# Patient Record
Sex: Female | Born: 1975 | Hispanic: Yes | Marital: Single | Smoking: Never smoker
Health system: Southern US, Community
[De-identification: ages and names within clinical notes are randomized; demographics above are authoritative.]

## PROBLEM LIST (undated history)

## (undated) DIAGNOSIS — R102 Pelvic and perineal pain: Secondary | ICD-10-CM

## (undated) DIAGNOSIS — M1990 Unspecified osteoarthritis, unspecified site: Secondary | ICD-10-CM

## (undated) DIAGNOSIS — M722 Plantar fascial fibromatosis: Secondary | ICD-10-CM

## (undated) DIAGNOSIS — G8929 Other chronic pain: Secondary | ICD-10-CM

## (undated) DIAGNOSIS — K219 Gastro-esophageal reflux disease without esophagitis: Secondary | ICD-10-CM

---

## 2013-09-02 ENCOUNTER — Emergency Department (HOSPITAL_COMMUNITY)
Admission: EM | Admit: 2013-09-02 | Discharge: 2013-09-02 | Disposition: A | Discharge status: Home / Self Care | Payer: Medicaid Other | Attending: Emergency Medicine | Admitting: Emergency Medicine

## 2013-09-02 ENCOUNTER — Encounter (HOSPITAL_COMMUNITY): Payer: Self-pay | Admitting: Emergency Medicine

## 2013-09-02 DIAGNOSIS — L03319 Cellulitis of trunk, unspecified: Principal | ICD-10-CM

## 2013-09-02 DIAGNOSIS — L02211 Cutaneous abscess of abdominal wall: Secondary | ICD-10-CM

## 2013-09-02 DIAGNOSIS — L02219 Cutaneous abscess of trunk, unspecified: Secondary | ICD-10-CM | POA: Insufficient documentation

## 2013-09-02 MED ORDER — HYDROCODONE-ACETAMINOPHEN 5-325 MG PO TABS
ORAL_TABLET | ORAL | Status: AC
  Filled 2013-09-02: qty 1

## 2013-09-02 MED ORDER — HYDROCODONE-ACETAMINOPHEN 5-325 MG PO TABS: 1.0000 | ORAL_TABLET | ORAL | Status: DC | PRN

## 2013-09-02 MED ORDER — CEPHALEXIN 500 MG PO CAPS: 500.0000 mg | ORAL_CAPSULE | Freq: Two times a day (BID) | ORAL | Status: AC

## 2013-09-02 MED ORDER — SULFAMETHOXAZOLE-TRIMETHOPRIM 800-160 MG PO TABS: 1.0000 | ORAL_TABLET | Freq: Two times a day (BID) | ORAL | Status: DC

## 2013-09-02 MED ORDER — HYDROCODONE-ACETAMINOPHEN 5-325 MG PO TABS
1.0000 | ORAL_TABLET | Freq: Once | ORAL | Status: AC
  Administered 2013-09-02: 1 via ORAL

## 2013-09-02 NOTE — Discharge Instructions (Signed)
Warm compresses several times a day  Vicodin for severe pain - Please be careful with this medication.  It can cause drowsiness.  Use caution while driving, operating machinery, drinking alcohol, or any other activities that may impair your physical or mental abilities.   Return to the emergency department if you develop any changing/worsening condition, fever, repeated vomiting, or any other concerns (please read additional information regarding your condition below)    Abscess An abscess is an infected area that contains a collection of pus and debris.It can occur in almost any part of the body. An abscess is also known as a furuncle or boil. CAUSES  An abscess occurs when tissue gets infected. This can occur from blockage of oil or sweat glands, infection of hair follicles, or a minor injury to the skin. As the body tries to fight the infection, pus collects in the area and creates pressure under the skin. This pressure causes pain. People with weakened immune systems have difficulty fighting infections and get certain abscesses more often.  SYMPTOMS Usually an abscess develops on the skin and becomes a painful mass that is red, warm, and tender. If the abscess forms under the skin, you may feel a moveable soft area under the skin. Some abscesses break open (rupture) on their own, but most will continue to get worse without care. The infection can spread deeper into the body and eventually into the bloodstream, causing you to feel ill.  DIAGNOSIS  Your caregiver will take your medical history and perform a physical exam. A sample of fluid may also be taken from the abscess to determine what is causing your infection. TREATMENT  Your caregiver may prescribe antibiotic medicines to fight the infection. However, taking antibiotics alone usually does not cure an abscess. Your caregiver may need to make a small cut (incision) in the abscess to drain the pus. In some cases, gauze is packed into the  abscess to reduce pain and to continue draining the area. HOME CARE INSTRUCTIONS   Only take over-the-counter or prescription medicines for pain, discomfort, or fever as directed by your caregiver.  If you were prescribed antibiotics, take them as directed. Finish them even if you start to feel better.  If gauze is used, follow your caregiver's directions for changing the gauze.  To avoid spreading the infection:  Keep your draining abscess covered with a bandage.  Wash your hands well.  Do not share personal care items, towels, or whirlpools with others.  Avoid skin contact with others.  Keep your skin and clothes clean around the abscess.  Keep all follow-up appointments as directed by your caregiver. SEEK MEDICAL CARE IF:   You have increased pain, swelling, redness, fluid drainage, or bleeding.  You have muscle aches, chills, or a general ill feeling.  You have a fever. MAKE SURE YOU:   Understand these instructions.  Will watch your condition.  Will get help right away if you are not doing well or get worse. Document Released: 12/07/2004 Document Revised: 08/29/2011 Document Reviewed: 05/12/2011 [facility] Patient Information 2015 [place], [place]. This information is not intended to replace advice given to you by your health care provider. Make sure you discuss any questions you have with your health care provider.

## 2013-09-02 NOTE — ED Notes (Signed)
Pt changed into gown.

## 2013-09-02 NOTE — ED Notes (Signed)
Per pt had raised area pop up under rt arm pit/side.  Pt states pain has increased.  Hard knot at surface with redness noted.  Has hx of same yet different location.

## 2013-09-02 NOTE — ED Provider Notes (Signed)
CSN: [id number]     Arrival date & time 09/02/13  1605 History  This chart was scribed for non-physician practitioner, Coral CeoJessica Palmer, PA-C working with Junius ArgyleForrest S Harrison, MD by Joaquin MusicKristina Sanchez-Matthews, ED scribe. This patient was seen in room WTR8/WTR8 and the patient's care was started at 6:23 PM.   Chief Complaint  Patient presents with  . Abscess   The history is provided by the patient. No language interpreter was used.   HPI Comments: Madison Flowers is a 38 y.o. Female with no PMH who presents to the Emergency Department complaining of an abscess to her R flank that presented 3 days ago. No previous known wound. No open wounds or drainage. She c/o a constant pain and swelling that has gradually increased. Pain worse with movement of her right arm and palpation. Pt has a hx of abscesses (breast and abdomen) but never in R flank. She is currently on her menstrual cycle. She denies having medication allergies or medical problems. No fever, chills, weakness, nausea, emesis, abdominal pain or other concerns.    History reviewed. No pertinent past medical history. History reviewed. No pertinent past surgical history. History reviewed. No pertinent family history. History  Substance Use Topics  . Smoking status: Never Smoker   . Smokeless tobacco: Not on file  . Alcohol Use: Yes     Comment: occasional   OB History   Grav Para Term Preterm Abortions TAB SAB Ect Mult Living                  Review of Systems  Constitutional: Negative for fever, chills, diaphoresis, activity change, appetite change and fatigue.  Gastrointestinal: Negative for nausea, vomiting and abdominal pain.  Musculoskeletal: Negative for myalgias.  Skin: Positive for color change and wound. Negative for rash.       Abscess.   Neurological: Negative for dizziness, weakness, light-headedness and headaches.  All other systems reviewed and are negative.   Allergies  Review of patient's allergies  indicates no known allergies.  Home Medications   Prior to Admission medications   Not on File   Triage Vitals:BP 122/55  Pulse 77  Temp(Src) 98.3 F (36.8 C) (Oral)  Resp 16  SpO2 98%  LMP 08/28/2013  Filed Vitals:   09/02/13 1615  BP: 122/55  Pulse: 77  Temp: 98.3 F (36.8 C)  TempSrc: Oral  Resp: 16  SpO2: 98%    Physical Exam  Nursing note and vitals reviewed. Constitutional: She is oriented to person, place, and time. She appears well-developed and well-nourished. No distress.  Non-toxic  HENT:  Head: Normocephalic and atraumatic.  Right Ear: External ear normal.  Left Ear: External ear normal.  Mouth/Throat: Oropharynx is clear and moist.  Eyes: Conjunctivae and EOM are normal.  Neck: Neck supple. No tracheal deviation present.  Cardiovascular: Normal rate.   Pulmonary/Chest: Effort normal. No respiratory distress.  Abdominal: Soft. She exhibits no distension. There is no tenderness.    Musculoskeletal: Normal range of motion. She exhibits no edema and no tenderness.  Neurological: She is alert and oriented to person, place, and time.  Skin: Skin is warm and dry. She is not diaphoretic.  3 x 4 cm area of erythema and induration to the right flank with a small area of fluctuance in the center. No open wounds or drainage. Area warm to the touch and tender.   Psychiatric: She has a normal mood and affect. Her behavior is normal.    ED Course  Procedures (including critical  care time)  DIAGNOSTIC STUDIES: Oxygen Saturation is 98% on RA, normal by my interpretation.    COORDINATION OF CARE: 6:25 PM-Discussed treatment plan which includes incision and drainage with pt at bedside and pt agreed to plan.   6:27 PM- INCISION AND DRAINAGE Performed by: Coral CeoJessica Palmer, PA-C Consent: Verbal consent obtained. Risks and benefits: risks, benefits and alternatives were discussed  Sterile Prep and Drape  Type: abscess  Body area: R flank  Local anesthetic:  lidocaine 2 % without epinephrine  Anesthetic total: 4 ml  Incision: 11 Blade  Complexity: complex Blunt dissection to breakup loculations  Drainage amount: moderate   Flushed with copious amount of sterile saline  Patient tolerance: Patient tolerated the procedure well with no immediate complications.  Packed with 1/4" iodoform gauze   6:32 PM-Will give Hydrocodone while in the ED.Informed pt to apply warm compresses to R flank and keep area clean & dry. Informed pt to have packed guaze removed in 2 days by returning to the ED; she denies having a PCP due to moving recently. Will discharge pt with antibiotic and Hydrocodone for pain.   Labs Review Labs Reviewed - No data to display  Imaging Review No results found.   EKG Interpretation None      MDM   Madison Flowers is a 38 y.o. female with no PMH who presents to the Emergency Department complaining of an abscess to her R flank that presented 3 days ago. Abscess drained in the ED and gauze packing material placed. Patient afebrile and non-toxic in appearance. Vital signs stable. Will discharge home on antibiotics. Feel this is likely infected with MRSA. Will cover with bactrim. Instructed patient to return in two days for packing removal. Wound care instructions discussed. Return precautions, discharge instructions, and follow-up was discussed with the patient before discharge.      Discharge Medication List as of 09/02/2013  6:40 PM    START taking these medications   Details  cephALEXin (KEFLEX) 500 MG capsule Take 1 capsule (500 mg total) by mouth 2 (two) times daily., Starting 09/02/2013, Last dose on Tue 09/02/13, Print    HYDROcodone-acetaminophen (NORCO/VICODIN) 5-325 MG per tablet Take 1-2 tablets by mouth every 4 (four) hours as needed for moderate pain or severe pain., Starting 09/02/2013, Until Discontinued, Print    sulfamethoxazole-trimethoprim (SEPTRA DS) 800-160 MG per tablet Take 1 tablet by mouth  every 12 (twelve) hours., Starting 09/02/2013, Until Discontinued, Print        Final impressions: 1. Abscess of flank      Thomasenia SalesJessica Katlin Palmer PA-C   I personally performed the services described in this documentation, which was scribed in my presence. The recorded information has been reviewed and is accurate.  Jillyn LedgerJessica K Palmer, PA-C 09/03/13 1105

## 2013-09-02 NOTE — ED Notes (Signed)
Pt states she has a ride home. 

## 2013-09-04 NOTE — ED Provider Notes (Signed)
Medical screening examination/treatment/procedure(s) were performed by non-physician practitioner and as supervising physician I was immediately available for consultation/collaboration.   EKG Interpretation None        Junius ArgyleForrest S Harrison, MD 09/04/13 1739

## 2013-12-12 ENCOUNTER — Other Ambulatory Visit (HOSPITAL_COMMUNITY)
Admission: RE | Admit: 2013-12-12 | Discharge: 2013-12-12 | Disposition: A | Discharge status: Home / Self Care | Payer: Medicaid Other | Source: Ambulatory Visit | Attending: Family Medicine | Admitting: Family Medicine

## 2013-12-12 ENCOUNTER — Ambulatory Visit: Payer: Medicaid Other | Attending: Family Medicine | Admitting: Family Medicine

## 2013-12-12 ENCOUNTER — Encounter: Payer: Self-pay | Admitting: Family Medicine

## 2013-12-12 VITALS — BP 124/80 | HR 98 | Temp 98.4°F | Resp 18 | Ht 61.0 in

## 2013-12-12 DIAGNOSIS — N760 Acute vaginitis: Secondary | ICD-10-CM | POA: Insufficient documentation

## 2013-12-12 DIAGNOSIS — Z01419 Encounter for gynecological examination (general) (routine) without abnormal findings: Secondary | ICD-10-CM | POA: Insufficient documentation

## 2013-12-12 DIAGNOSIS — Z975 Presence of (intrauterine) contraceptive device: Secondary | ICD-10-CM | POA: Insufficient documentation

## 2013-12-12 DIAGNOSIS — S76111A Strain of right quadriceps muscle, fascia and tendon, initial encounter: Secondary | ICD-10-CM | POA: Diagnosis not present

## 2013-12-12 DIAGNOSIS — S8001XA Contusion of right knee, initial encounter: Secondary | ICD-10-CM | POA: Diagnosis not present

## 2013-12-12 DIAGNOSIS — S86819A Strain of other muscle(s) and tendon(s) at lower leg level, unspecified leg, initial encounter: Secondary | ICD-10-CM | POA: Insufficient documentation

## 2013-12-12 DIAGNOSIS — S86811A Strain of other muscle(s) and tendon(s) at lower leg level, right leg, initial encounter: Secondary | ICD-10-CM

## 2013-12-12 DIAGNOSIS — Z1151 Encounter for screening for human papillomavirus (HPV): Secondary | ICD-10-CM | POA: Insufficient documentation

## 2013-12-12 DIAGNOSIS — Z113 Encounter for screening for infections with a predominantly sexual mode of transmission: Secondary | ICD-10-CM | POA: Diagnosis present

## 2013-12-12 DIAGNOSIS — Z124 Encounter for screening for malignant neoplasm of cervix: Secondary | ICD-10-CM | POA: Diagnosis not present

## 2013-12-12 DIAGNOSIS — IMO0002 Reserved for concepts with insufficient information to code with codable children: Secondary | ICD-10-CM | POA: Insufficient documentation

## 2013-12-12 DIAGNOSIS — A499 Bacterial infection, unspecified: Secondary | ICD-10-CM

## 2013-12-12 DIAGNOSIS — Z23 Encounter for immunization: Secondary | ICD-10-CM | POA: Diagnosis not present

## 2013-12-12 DIAGNOSIS — K644 Residual hemorrhoidal skin tags: Secondary | ICD-10-CM | POA: Diagnosis not present

## 2013-12-12 DIAGNOSIS — N941 Dyspareunia: Secondary | ICD-10-CM | POA: Diagnosis not present

## 2013-12-12 DIAGNOSIS — W010XXA Fall on same level from slipping, tripping and stumbling without subsequent striking against object, initial encounter: Secondary | ICD-10-CM | POA: Insufficient documentation

## 2013-12-12 DIAGNOSIS — K649 Unspecified hemorrhoids: Secondary | ICD-10-CM | POA: Insufficient documentation

## 2013-12-12 DIAGNOSIS — B9689 Other specified bacterial agents as the cause of diseases classified elsewhere: Secondary | ICD-10-CM

## 2013-12-12 HISTORY — DX: Unspecified hemorrhoids: K64.9

## 2013-12-12 HISTORY — DX: Strain of other muscle(s) and tendon(s) at lower leg level, unspecified leg, initial encounter: S86.819A

## 2013-12-12 LAB — POCT URINALYSIS DIPSTICK
BILIRUBIN UA: NEGATIVE
GLUCOSE UA: NEGATIVE
Ketones, UA: NEGATIVE
LEUKOCYTES UA: NEGATIVE
NITRITE UA: NEGATIVE
Protein, UA: 30
Spec Grav, UA: 1.02
Urobilinogen, UA: 0.2
pH, UA: 6

## 2013-12-12 LAB — POCT URINE PREGNANCY: Preg Test, Ur: NEGATIVE

## 2013-12-12 MED ORDER — IBUPROFEN 600 MG PO TABS: 600.0000 mg | ORAL_TABLET | Freq: Three times a day (TID) | ORAL | Status: DC | PRN

## 2013-12-12 NOTE — Assessment & Plan Note (Signed)
A; normal pelvic exam P: F/u cultures and pap smear Position variation  F/u prn worsening symptoms, next step is pelvic U/S

## 2013-12-12 NOTE — Assessment & Plan Note (Signed)
A: small skin tags from previous hemorrhoids P: OTC tucks wipes

## 2013-12-12 NOTE — Progress Notes (Signed)
Establish Care Complaining of low abdominal pain.  Stated has pain and bleeding with sex

## 2013-12-12 NOTE — Patient Instructions (Addendum)
Madison Flowers,  Gracias por venido hoy. Your pelvic exam is normal.  You will be called with pap results and blood test results.  F/u if pain worsens. The next step is pelvic ultrasound.    For R knee pain: Rest Ice Elevate Take ibuprofen  Return if pain worsens or you have trouble walking.  Dr. Armen PickupFunches

## 2013-12-12 NOTE — Progress Notes (Signed)
   Subjective:    Patient ID: Madison Flowers, female    DOB: 04/14/1975, 38 y.o.   MRN: [id number] CC: pelvic pain during sex  HPI 38 yo F presents to establish care and discuss the following:  1. Pelvic pain during sex: x 6 months with most recent sex partner. No similar symptoms with previous partners. Pain occurs in all sexual positions. Some scant bleeding after sex. No condom use. No vaginal discharge. Has an IUD in place for past 6 years. Last pap smear was 7 years ago and normal.   Soc hx: non smoker  Review of Systems As per HPI  Nodules near rectum, blood bowel movement sometimes     Objective:   Physical Exam BP 124/80  Pulse 98  Temp(Src) 98.4 F (36.9 C) (Oral)  Resp 18  Ht 5\' 1"  (1.549 m)  SpO2 99%  LMP 11/25/2013 General appearance: alert, cooperative, no distress and morbidly obese Pelvic: external genitalia normal except small external hemorrhoidal skin tags w/o thrombosis. Vagina normal. Cervix normal with scant bloody discharge. IUD strings visualized. No CMT. No uterine or adnexal mass or tenderness.    While patient was walking down the hall to leave she slipped on the hell of her boots and fell onto her R knee.  She was helped up to a wheelchair. I examined her knee and ankle. R knee-mild erythema, ROM full extension, flexion limited to 90 degrees. Non tender joint line. Non tender patella. Tendon patella tendon at distal insertion.   R ankle- full ROM, no swelling, no erythema, no deformity, no tenderness.  Ice pack was giving. Patient able to ambulate unassisted.  A: R knee contusion with patellar tendon strain  P: Patient was encouraged to go to urgent care if she had worsening pain or inability to ambulate. Ibuprofen prescribed.  Rest, ice, elevation.       Assessment & Plan:

## 2013-12-12 NOTE — Assessment & Plan Note (Signed)
A: R knee contusion with patellar tendon strain.  P: Patient was encouraged to go to urgent care if she had worsening pain or inability to ambulate. Ibuprofen prescribed.  Rest, ice, elevation.

## 2013-12-12 NOTE — Assessment & Plan Note (Signed)
Pap done today  

## 2013-12-12 NOTE — Assessment & Plan Note (Signed)
Screening HIV and RPR 

## 2013-12-13 LAB — HIV ANTIBODY (ROUTINE TESTING W REFLEX): HIV 1&2 Ab, 4th Generation: NONREACTIVE

## 2013-12-13 LAB — RPR

## 2013-12-15 ENCOUNTER — Telehealth: Payer: Self-pay | Admitting: *Deleted

## 2013-12-15 LAB — CERVICOVAGINAL ANCILLARY ONLY
WET PREP (BD AFFIRM): NEGATIVE
Wet Prep (BD Affirm): NEGATIVE
Wet Prep (BD Affirm): POSITIVE — AB

## 2013-12-15 NOTE — Telephone Encounter (Signed)
Message copied by Lajeana Strough,Dyann Kief Alphia Behanna M on Mon Dec 15, 2013  5:39 PM ------      Message from: Dessa PhiFUNCHES, JOSALYN      Created: Mon Dec 15, 2013 12:03 PM       HIV and RPR negative ------

## 2013-12-15 NOTE — Telephone Encounter (Signed)
Unable to contact Pt, wrong number 

## 2013-12-16 DIAGNOSIS — N760 Acute vaginitis: Secondary | ICD-10-CM

## 2013-12-16 DIAGNOSIS — B9689 Other specified bacterial agents as the cause of diseases classified elsewhere: Secondary | ICD-10-CM | POA: Insufficient documentation

## 2013-12-16 LAB — CERVICOVAGINAL ANCILLARY ONLY
Chlamydia: NEGATIVE
Neisseria Gonorrhea: NEGATIVE

## 2013-12-16 LAB — CYTOLOGY - PAP

## 2013-12-16 MED ORDER — METRONIDAZOLE 500 MG PO TABS: 500.0000 mg | ORAL_TABLET | Freq: Two times a day (BID) | ORAL | Status: DC

## 2013-12-16 MED ORDER — FLUCONAZOLE 150 MG PO TABS: 150.0000 mg | ORAL_TABLET | Freq: Every day | ORAL | Status: DC

## 2013-12-16 NOTE — Assessment & Plan Note (Signed)
BV on wet prep  flagyl 500 mg BID to be followed by diflucan.

## 2013-12-16 NOTE — Addendum Note (Signed)
Addended by: Dessa PhiFUNCHES, Aluna Whiston on: 12/16/2013 09:32 AM   Modules accepted: Orders

## 2013-12-17 ENCOUNTER — Emergency Department (HOSPITAL_COMMUNITY): Payer: Medicaid Other

## 2013-12-17 ENCOUNTER — Other Ambulatory Visit: Payer: Self-pay

## 2013-12-17 ENCOUNTER — Emergency Department (HOSPITAL_COMMUNITY)
Admission: EM | Admit: 2013-12-17 | Discharge: 2013-12-17 | Disposition: A | Discharge status: Home / Self Care | Payer: Medicaid Other | Attending: Emergency Medicine | Admitting: Emergency Medicine

## 2013-12-17 ENCOUNTER — Encounter (HOSPITAL_COMMUNITY): Payer: Self-pay | Admitting: Emergency Medicine

## 2013-12-17 DIAGNOSIS — K219 Gastro-esophageal reflux disease without esophagitis: Secondary | ICD-10-CM | POA: Insufficient documentation

## 2013-12-17 DIAGNOSIS — R079 Chest pain, unspecified: Secondary | ICD-10-CM | POA: Diagnosis present

## 2013-12-17 DIAGNOSIS — W01198A Fall on same level from slipping, tripping and stumbling with subsequent striking against other object, initial encounter: Secondary | ICD-10-CM | POA: Insufficient documentation

## 2013-12-17 DIAGNOSIS — S8991XA Unspecified injury of right lower leg, initial encounter: Secondary | ICD-10-CM | POA: Diagnosis not present

## 2013-12-17 DIAGNOSIS — Y92129 Unspecified place in nursing home as the place of occurrence of the external cause: Secondary | ICD-10-CM | POA: Insufficient documentation

## 2013-12-17 DIAGNOSIS — M25561 Pain in right knee: Secondary | ICD-10-CM

## 2013-12-17 DIAGNOSIS — Z3202 Encounter for pregnancy test, result negative: Secondary | ICD-10-CM | POA: Insufficient documentation

## 2013-12-17 DIAGNOSIS — Y9389 Activity, other specified: Secondary | ICD-10-CM | POA: Insufficient documentation

## 2013-12-17 LAB — URINALYSIS, ROUTINE W REFLEX MICROSCOPIC
BILIRUBIN URINE: NEGATIVE
GLUCOSE, UA: NEGATIVE mg/dL
KETONES UR: NEGATIVE mg/dL
Leukocytes, UA: NEGATIVE
NITRITE: NEGATIVE
PH: 5.5 (ref 5.0–8.0)
Protein, ur: NEGATIVE mg/dL
SPECIFIC GRAVITY, URINE: 1.018 (ref 1.005–1.030)
Urobilinogen, UA: 0.2 mg/dL (ref 0.0–1.0)

## 2013-12-17 LAB — URINE MICROSCOPIC-ADD ON

## 2013-12-17 LAB — LIPASE, BLOOD: LIPASE: 17 U/L (ref 11–59)

## 2013-12-17 LAB — HEPATIC FUNCTION PANEL
ALK PHOS: 62 U/L (ref 39–117)
ALT: 8 U/L (ref 0–35)
AST: 10 U/L (ref 0–37)
Albumin: 3.2 g/dL — ABNORMAL LOW (ref 3.5–5.2)
Bilirubin, Direct: <0.2 mg/dL (ref 0.0–0.3)
Total Bilirubin: 0.6 mg/dL (ref 0.3–1.2)
Total Protein: 7.9 g/dL (ref 6.0–8.3)

## 2013-12-17 LAB — I-STAT TROPONIN, ED
Troponin i, poc: 0 ng/mL (ref 0.00–0.08)
Troponin i, poc: 0 ng/mL (ref 0.00–0.08)

## 2013-12-17 LAB — CBC
HEMATOCRIT: 33.9 % — AB (ref 36.0–46.0)
HEMOGLOBIN: 11.3 g/dL — AB (ref 12.0–15.0)
MCH: 29.7 pg (ref 26.0–34.0)
MCHC: 33.3 g/dL (ref 30.0–36.0)
MCV: 89.2 fL (ref 78.0–100.0)
Platelets: 326 10*3/uL (ref 150–400)
RBC: 3.8 MIL/uL — ABNORMAL LOW (ref 3.87–5.11)
RDW: 12.3 % (ref 11.5–15.5)
WBC: 7.9 10*3/uL (ref 4.0–10.5)

## 2013-12-17 LAB — BASIC METABOLIC PANEL
Anion gap: 9 (ref 5–15)
BUN: 11 mg/dL (ref 6–23)
CO2: 26 mEq/L (ref 19–32)
Calcium: 8.6 mg/dL (ref 8.4–10.5)
Chloride: 102 mEq/L (ref 96–112)
Creatinine, Ser: 0.51 mg/dL (ref 0.50–1.10)
GFR calc non Af Amer: >90 mL/min (ref >90)
GLUCOSE: 97 mg/dL (ref 70–99)
Potassium: 3.9 mEq/L (ref 3.7–5.3)
Sodium: 137 mEq/L (ref 137–147)

## 2013-12-17 LAB — PRO B NATRIURETIC PEPTIDE: PRO B NATRI PEPTIDE: 100.7 pg/mL (ref 0–125)

## 2013-12-17 LAB — POC URINE PREG, ED: Preg Test, Ur: NEGATIVE

## 2013-12-17 LAB — D-DIMER, QUANTITATIVE (NOT AT ARMC): D-Dimer, Quant: 0.59 ug/mL-FEU — ABNORMAL HIGH (ref 0.00–0.48)

## 2013-12-17 MED ORDER — MORPHINE SULFATE 4 MG/ML IJ SOLN
4.0000 mg | Freq: Once | INTRAMUSCULAR | Status: AC
  Administered 2013-12-17: 4 mg via INTRAVENOUS
  Filled 2013-12-17: qty 1

## 2013-12-17 MED ORDER — PANTOPRAZOLE SODIUM 20 MG PO TBEC: 20.0000 mg | DELAYED_RELEASE_TABLET | Freq: Every day | ORAL | Status: DC

## 2013-12-17 MED ORDER — SODIUM CHLORIDE 0.9 % IV BOLUS (SEPSIS)
1000.0000 mL | Freq: Once | INTRAVENOUS | Status: AC
  Administered 2013-12-17: 1000 mL via INTRAVENOUS

## 2013-12-17 MED ORDER — IOHEXOL 350 MG/ML SOLN
100.0000 mL | Freq: Once | INTRAVENOUS | Status: AC | PRN
  Administered 2013-12-17: 100 mL via INTRAVENOUS

## 2013-12-17 NOTE — Discharge Instructions (Signed)
Please call your doctor for a followup appointment within 24-48 hours. When you talk to your doctor please let them know that you were seen in the emergency department and have them acquire all of your records so that they can discuss the findings with you and formulate a treatment plan to fully care for your new and ongoing problems. Please call and set up an appointment with gastroenterology and orthopedics Please rest, ice, elevate the right knee please keep in a flexed fashion to aid in reduction of worsening pain Please avoid any fatty greasy foods, spicy foods, alcohol Please take medications as prescribed Please increase water intake Please continue to monitor symptoms closely and if symptoms are to worsen or change (fever greater than 101, chills, sweating, nausea, vomiting, chest pain, shortness of breathe, difficulty breathing, weakness, numbness, tingling, worsening or changes to pain pattern, blood in the stools, black tarry stools, inability to keep any food or fluids down, changes or worsening chest pain) please report back to the Emergency Department immediately.   Food Choices for Gastroesophageal Reflux Disease When you have gastroesophageal reflux disease (GERD), the foods you eat and your eating habits are very important. Choosing the right foods can help ease the discomfort of GERD. WHAT GENERAL GUIDELINES DO I NEED TO FOLLOW?  Choose fruits, vegetables, whole grains, low-fat dairy products, and low-fat meat, fish, and poultry.  Limit fats such as oils, salad dressings, butter, nuts, and avocado.  Keep a food diary to identify foods that cause symptoms.  Avoid foods that cause reflux. These may be different for different people.  Eat frequent small meals instead of three large meals each day.  Eat your meals slowly, in a relaxed setting.  Limit fried foods.  Cook foods using methods other than frying.  Avoid drinking alcohol.  Avoid drinking large amounts of liquids  with your meals.  Avoid bending over or lying down until 2-3 hours after eating. WHAT FOODS ARE NOT RECOMMENDED? The following are some foods and drinks that may worsen your symptoms: Vegetables Tomatoes. Tomato juice. Tomato and spaghetti sauce. Chili peppers. Onion and garlic. Horseradish. Fruits Oranges, grapefruit, and lemon (fruit and juice). Meats High-fat meats, fish, and poultry. This includes hot dogs, ribs, ham, sausage, salami, and bacon. Dairy Whole milk and chocolate milk. Sour cream. Cream. Butter. Ice cream. Cream cheese.  Beverages Coffee and tea, with or without caffeine. Carbonated beverages or energy drinks. Condiments Hot sauce. Barbecue sauce.  Sweets/Desserts Chocolate and cocoa. Donuts. Peppermint and spearmint. Fats and Oils High-fat foods, including [place] fries and potato chips. Other Vinegar. Strong spices, such as black pepper, white pepper, red pepper, cayenne, curry powder, cloves, ginger, and chili powder. The items listed above may not be a complete list of foods and beverages to avoid. Contact your dietitian for more information. Document Released: 02/27/2005 Document Revised: 03/04/2013 Document Reviewed: 01/01/2013 [facility] Patient Information 2015 [place], [place]. This information is not intended to replace advice given to you by your health care provider. Make sure you discuss any questions you have with your health care provider.   Gastroesophageal Reflux Disease, Adult Gastroesophageal reflux disease (GERD) happens when acid from your stomach flows up into the esophagus. When acid comes in contact with the esophagus, the acid causes soreness (inflammation) in the esophagus. Over time, GERD may create small holes (ulcers) in the lining of the esophagus. CAUSES   Increased body weight. This puts pressure on the stomach, making acid rise from the stomach into the esophagus.  Smoking. This  increases acid production in the stomach.  Drinking  alcohol. This causes decreased pressure in the lower esophageal sphincter (valve or ring of muscle between the esophagus and stomach), allowing acid from the stomach into the esophagus.  Late evening meals and a full stomach. This increases pressure and acid production in the stomach.  A malformed lower esophageal sphincter. Sometimes, no cause is found. SYMPTOMS   Burning pain in the lower part of the mid-chest behind the breastbone and in the mid-stomach area. This may occur twice a week or more often.  Trouble swallowing.  Sore throat.  Dry cough.  Asthma-like symptoms including chest tightness, shortness of breath, or wheezing. DIAGNOSIS  Your caregiver may be able to diagnose GERD based on your symptoms. In some cases, X-rays and other tests may be done to check for complications or to check the condition of your stomach and esophagus. TREATMENT  Your caregiver may recommend over-the-counter or prescription medicines to help decrease acid production. Ask your caregiver before starting or adding any new medicines.  HOME CARE INSTRUCTIONS   Change the factors that you can control. Ask your caregiver for guidance concerning weight loss, quitting smoking, and alcohol consumption.  Avoid foods and drinks that make your symptoms worse, such as:  Caffeine or alcoholic drinks.  Chocolate.  Peppermint or mint flavorings.  Garlic and onions.  Spicy foods.  Citrus fruits, such as oranges, lemons, or limes.  Tomato-based foods such as sauce, chili, salsa, and pizza.  Fried and fatty foods.  Avoid lying down for the 3 hours prior to your bedtime or prior to taking a nap.  Eat small, frequent meals instead of large meals.  Wear loose-fitting clothing. Do not wear anything tight around your waist that causes pressure on your stomach.  Raise the head of your bed 6 to 8 inches with wood blocks to help you sleep. Extra pillows will not help.  Only take over-the-counter or  prescription medicines for pain, discomfort, or fever as directed by your caregiver.  Do not take aspirin, ibuprofen, or other nonsteroidal anti-inflammatory drugs (NSAIDs). SEEK IMMEDIATE MEDICAL CARE IF:   You have pain in your arms, neck, jaw, teeth, or back.  Your pain increases or changes in intensity or duration.  You develop nausea, vomiting, or sweating (diaphoresis).  You develop shortness of breath, or you faint.  Your vomit is green, yellow, black, or looks like coffee grounds or blood.  Your stool is red, bloody, or black. These symptoms could be signs of other problems, such as heart disease, gastric bleeding, or esophageal bleeding. MAKE SURE YOU:   Understand these instructions.  Will watch your condition.  Will get help right away if you are not doing well or get worse. Document Released: 12/07/2004 Document Revised: 05/22/2011 Document Reviewed: 09/16/2010 [facility] Patient Information 2015 [place], [place]. This information is not intended to replace advice given to you by your health care provider. Make sure you discuss any questions you have with your health care provider.

## 2013-12-17 NOTE — ED Notes (Signed)
Patient asking for pain medication Will make EDP aware 

## 2013-12-17 NOTE — ED Provider Notes (Signed)
CSN: [id number]     Arrival date & time 12/17/13  1001 History   First MD Initiated Contact with Patient 12/17/13 1047     Chief Complaint  Patient presents with  . Chest Pain  . Knee Pain     (Consider location/radiation/quality/duration/timing/severity/associated sxs/prior Treatment) The history is provided by the patient. No language interpreter was used.  Madison Flowers is a 38 y/o F with no known significant PMHx presenting to the ED with chest pain and right knee pain. Patient reported that the chest pain started approximately 3-4 days ago. Stated that the chest pain started in the upper epigastric region and worked its way into the center of her chest described as an intermittent burning, aching sensation. Stated that the pain worsens after eating and when laying down flat. Stated that she has mild discomfort of breathing with the increase in chest pain. Stated that she hs not been participating in alcohol - stated that she eats spicy foods every once in a while. Patient reported that she is concerned this can be stomach cancer secondary to her mother recently being diagnosed with stomach cancer - denied abdominal pain, unexplained weight loss. Reported that she has been having right knee pain since last Friday when she fell at the Clinic - stated that the floor was slippery from the rain and that she slipped and landed on her right knee. Described the discomfort as a sharp pain without radiation - stated that she has been resting and icing the knee. Stated that the pain is worse with ambulation. Denied shortness of breath, difficulty breathing, dizziness, headache, nausea, vomiting, diarrhea, melena, hematochezia, dysuria, hematuria, blurred vision, sudden loss of vision, neck pain, fever, chills, abdominal pain, hemoptysis.  PCP Dr. Armen Pickup  History reviewed. No pertinent past medical history. History reviewed. No pertinent past surgical history. Family History  Problem Relation  Age of Onset  . Cancer Mother     ovarian   . Depression Mother   . Diabetes Maternal Uncle   . Heart disease Maternal Uncle   . Heart disease Maternal Grandmother    History  Substance Use Topics  . Smoking status: Never Smoker   . Smokeless tobacco: Never Used  . Alcohol Use: Yes     Comment: occasional   OB History   Grav Para Term Preterm Abortions TAB SAB Ect Mult Living                 Review of Systems  Constitutional: Negative for fever and chills.  Respiratory: Positive for shortness of breath. Negative for chest tightness.   Cardiovascular: Positive for chest pain.  Gastrointestinal: Positive for nausea and abdominal pain (epigastric). Negative for vomiting, diarrhea, constipation, blood in stool and anal bleeding.  Genitourinary: Negative for dysuria, hematuria and pelvic pain.  Musculoskeletal: Negative for back pain, neck pain and neck stiffness.  Neurological: Negative for dizziness, weakness and headaches.      Allergies  Review of patient's allergies indicates no known allergies.  Home Medications   Prior to Admission medications   Medication Sig Start Date End Date Taking? Authorizing Provider  acetaminophen (TYLENOL) 325 MG tablet Take 650 mg by mouth every 6 (six) hours as needed (pain).   Yes Historical Provider, MD  ibuprofen (ADVIL,MOTRIN) 600 MG tablet Take 600 mg by mouth every 8 (eight) hours as needed (knee pain).   Yes Historical Provider, MD  pantoprazole (PROTONIX) 20 MG tablet Take 1 tablet (20 mg total) by mouth daily. 12/17/13   Precious Segall,  PA-C   BP 124/51  Pulse 59  Temp(Src) 98.2 F (36.8 C) (Oral)  Resp 14  SpO2 100%  LMP 11/25/2013 Physical Exam  Nursing note and vitals reviewed. Constitutional: She is oriented to person, place, and time. She appears well-developed and well-nourished. No distress.  HENT:  Head: Normocephalic and atraumatic.  Mouth/Throat: Oropharynx is clear and moist. No oropharyngeal exudate.  Eyes:  Conjunctivae and EOM are normal. Pupils are equal, round, and reactive to light. Right eye exhibits no discharge. Left eye exhibits no discharge.  Neck: Normal range of motion. Neck supple. No tracheal deviation present.  Negative neck stiffness Negative nuchal rigidity  Negative cervical lymphadenopathy  Negative meningeal signs   Cardiovascular: Normal rate, regular rhythm and normal heart sounds.  Exam reveals no friction rub.   No murmur heard. Pulses:      Radial pulses are 2+ on the right side, and 2+ on the left side.       Dorsalis pedis pulses are 2+ on the right side, and 2+ on the left side.  Pulmonary/Chest: Effort normal and breath sounds normal. No respiratory distress. She has no wheezes. She has no rales. She exhibits no tenderness.  Patient is able to speak in full sentences without difficulty  Negative use of accessory muscles Negative stridor  Mild pain upon palpation to the center of the chest - pain is reproducible  Abdominal: Soft. Bowel sounds are normal. She exhibits no distension. There is tenderness in the epigastric area. There is no rebound and no guarding.  Obese BS normoactive in all 4 quadrants Abdomen soft upon palpation  Mild discomfort upon palpation to the epigastric region  Negative peritoneal signs   Musculoskeletal: Normal range of motion. She exhibits tenderness.  Negative swelling, erythema, inflammation, lesions, sores, deformities noted to the right knee. Discomfort upon palpation to the right knee circumferentially. Full flexion and extension noted. Full ROM to the right ankle and digits of the right foot without difficulty or ataxia.    Lymphadenopathy:    She has no cervical adenopathy.  Neurological: She is alert and oriented to person, place, and time. No cranial nerve deficit. She exhibits normal muscle tone. Coordination normal.  Cranial nerves III-XII grossly intact Strength 5+/5+ to upper and lower extremities bilaterally with  resistance applied, equal distribution noted Equal grip strength bilaterally  Sensation intact with differentiation to sharp and dull touch Negative facial drooping Negative slurred speech  Negative aphasia Negative arm drift  Skin: Skin is warm and dry. No rash noted. She is not diaphoretic. No erythema.  Psychiatric: She has a normal mood and affect. Her behavior is normal. Thought content normal.    ED Course  Procedures (including critical care time)  Results for orders placed during the hospital encounter of 12/17/13  CBC      Result Value Ref Range   WBC 7.9  4.0 - 10.5 K/uL   RBC 3.80 (*) 3.87 - 5.11 MIL/uL   Hemoglobin 11.3 (*) 12.0 - 15.0 g/dL   HCT 16.1 (*) 09.6 - 04.5 %   MCV 89.2  78.0 - 100.0 fL   MCH 29.7  26.0 - 34.0 pg   MCHC 33.3  30.0 - 36.0 g/dL   RDW 40.9  81.1 - 91.4 %   Platelets 326  150 - 400 K/uL  BASIC METABOLIC PANEL      Result Value Ref Range   Sodium 137  137 - 147 mEq/L   Potassium 3.9  3.7 - 5.3 mEq/L  Chloride 102  96 - 112 mEq/L   CO2 26  19 - 32 mEq/L   Glucose, Bld 97  70 - 99 mg/dL   BUN 11  6 - 23 mg/dL   Creatinine, Ser 7.82  0.50 - 1.10 mg/dL   Calcium 8.6  8.4 - 95.6 mg/dL   GFR calc non Af Amer >90  >90 mL/min   GFR calc Af Amer >90  >90 mL/min   Anion gap 9  5 - 15  URINALYSIS, ROUTINE W REFLEX MICROSCOPIC      Result Value Ref Range   Color, Urine YELLOW  YELLOW   APPearance CLOUDY (*) CLEAR   Specific Gravity, Urine 1.018  1.005 - 1.030   pH 5.5  5.0 - 8.0   Glucose, UA NEGATIVE  NEGATIVE mg/dL   Hgb urine dipstick SMALL (*) NEGATIVE   Bilirubin Urine NEGATIVE  NEGATIVE   Ketones, ur NEGATIVE  NEGATIVE mg/dL   Protein, ur NEGATIVE  NEGATIVE mg/dL   Urobilinogen, UA 0.2  0.0 - 1.0 mg/dL   Nitrite NEGATIVE  NEGATIVE   Leukocytes, UA NEGATIVE  NEGATIVE  PRO B NATRIURETIC PEPTIDE      Result Value Ref Range   Pro B Natriuretic peptide (BNP) 100.7  0 - 125 pg/mL  D-DIMER, QUANTITATIVE      Result Value Ref Range    D-Dimer, Quant 0.59 (*) 0.00 - 0.48 ug/mL-FEU  LIPASE, BLOOD      Result Value Ref Range   Lipase 17  11 - 59 U/L  HEPATIC FUNCTION PANEL      Result Value Ref Range   Total Protein 7.9  6.0 - 8.3 g/dL   Albumin 3.2 (*) 3.5 - 5.2 g/dL   AST 10  0 - 37 U/L   ALT 8  0 - 35 U/L   Alkaline Phosphatase 62  39 - 117 U/L   Total Bilirubin 0.6  0.3 - 1.2 mg/dL   Bilirubin, Direct <2.1  0.0 - 0.3 mg/dL   Indirect Bilirubin NOT CALCULATED  0.3 - 0.9 mg/dL  URINE MICROSCOPIC-ADD ON      Result Value Ref Range   Squamous Epithelial / LPF RARE  RARE   WBC, UA 0-2  <3 WBC/hpf   RBC / HPF 11-20  <3 RBC/hpf   Bacteria, UA FEW (*) RARE  I-STAT TROPOININ, ED      Result Value Ref Range   Troponin i, poc 0.00  0.00 - 0.08 ng/mL   Comment 3           POC URINE PREG, ED      Result Value Ref Range   Preg Test, Ur NEGATIVE  NEGATIVE  I-STAT TROPOININ, ED      Result Value Ref Range   Troponin i, poc 0.00  0.00 - 0.08 ng/mL   Comment 3             Labs Review Labs Reviewed  CBC - Abnormal; Notable for the following:    RBC 3.80 (*)    Hemoglobin 11.3 (*)    HCT 33.9 (*)    All other components within normal limits  URINALYSIS, ROUTINE W REFLEX MICROSCOPIC - Abnormal; Notable for the following:    APPearance CLOUDY (*)    Hgb urine dipstick SMALL (*)    All other components within normal limits  D-DIMER, QUANTITATIVE - Abnormal; Notable for the following:    D-Dimer, Quant 0.59 (*)    All other components within normal limits  HEPATIC  FUNCTION PANEL - Abnormal; Notable for the following:    Albumin 3.2 (*)    All other components within normal limits  URINE MICROSCOPIC-ADD ON - Abnormal; Notable for the following:    Bacteria, UA FEW (*)    All other components within normal limits  BASIC METABOLIC PANEL  PRO B NATRIURETIC PEPTIDE  LIPASE, BLOOD  I-STAT TROPOININ, ED  POC URINE PREG, ED  I-STAT TROPOININ, ED    Imaging Review Ct Angio Chest Pe W/cm &/or Wo Cm  12/17/2013    CLINICAL DATA:  chest pain for 3-4 days.  Elevated D-dimer.  EXAM: CT ANGIOGRAPHY CHEST WITH CONTRAST  TECHNIQUE: Multidetector CT imaging of the chest was performed using the standard protocol during bolus administration of intravenous contrast. Multiplanar CT image reconstructions and MIPs were obtained to evaluate the vascular anatomy.  CONTRAST:  OMNIPAQUE IOHEXOL 350 MG/ML SOLN  COMPARISON:  None.  FINDINGS: Mediastinum: The heart size appears normal. There is no pericardial effusion. The trachea appears patent and is midline. Normal appearance of the esophagus. No enlarged mediastinal or hilar lymph nodes. The main pulmonary artery is patent. No lobar or segmental pulmonary artery filling defects identified to suggest acute pulmonary embolus.  Lungs/Pleura: No pleural effusions. There is no airspace consolidation. No suspicious pulmonary nodules or mass is identified.  Upper Abdomen: Incidental imaging through the upper abdomen is unremarkable. The visualized portions of the liver and spleen are normal.  Musculoskeletal: Review of the visualized osseous structures is significant for mild spondylosis within the thoracic spine.  Review of the MIP images confirms the above findings.  IMPRESSION: 1. No evidence for acute pulmonary embolus.   Electronically Signed   By: Signa Kell M.D.   On: 12/17/2013 14:33   US Abdomen Complete  12/17/2013   CLINICAL DATA:  Epigastric/upper abdominal pain  EXAM: ULTRASOUND ABDOMEN COMPLETE  COMPARISON:  None.  FINDINGS: Gallbladder: No gallstones or wall thickening visualized. There is no pericholecystic fluid. No sonographic Murphy sign noted.  Common bile duct: Diameter: 5 mm. There is no intrahepatic, common hepatic, or common bile duct dilatation.  Liver: No focal lesion identified.  Liver echogenicity is increased.  IVC: No abnormality visualized.  Pancreas: No mass or inflammatory focus.  Spleen: Size and appearance within normal limits.  Right Kidney: Length:  12.7 cm. Echogenicity within normal limits. No mass or hydronephrosis visualized.  Left Kidney: Length: 12.0 cm. Echogenicity within normal limits. No mass or hydronephrosis visualized.  Abdominal aorta: No aneurysm visualized.  Other findings: No demonstrable ascites.  IMPRESSION: Increased liver echogenicity, a finding most likely due to hepatic steatosis. While no focal liver lesions are identified, it must be cautioned that the sensitivity of ultrasound for focal liver lesions is diminished in this circumstance. Study otherwise unremarkable.   Electronically Signed   By: Bretta Bang M.D.   On: 12/17/2013 13:31   Dg Chest Port 1 View  12/17/2013   CLINICAL DATA:  Chest pain for 3-4 days, worse when lying down. Shortness of breath.  EXAM: PORTABLE CHEST - 1 VIEW  COMPARISON:  None.  FINDINGS: Body habitus reduces diagnostic sensitivity and specificity.  The lungs appear clear.  Cardiac and mediastinal contours normal.  No pleural effusion identified.  IMPRESSION: 1.  No significant abnormality identified.   Electronically Signed   By: Herbie Baltimore M.D.   On: 12/17/2013 11:37   Dg Knee Complete 4 Views Right  12/17/2013   CLINICAL DATA:  Fall  EXAM: RIGHT KNEE - COMPLETE 4+  VIEW  COMPARISON:  None.  FINDINGS: Four views of the right knee submitted. No acute fracture or subluxation. No joint effusion. No radiopaque foreign body.  IMPRESSION: Negative.   Electronically Signed   By: Natasha MeadLiviu  Pop M.D.   On: 12/17/2013 12:54     EKG Interpretation None      MDM   Final diagnoses:  Gastroesophageal reflux disease, esophagitis presence not specified  Right knee pain    Medications  sodium chloride 0.9 % bolus 1,000 mL (0 mLs Intravenous Stopped 12/17/13 1319)  morphine 4 MG/ML injection 4 mg (4 mg Intravenous Given 12/17/13 1331)  iohexol (OMNIPAQUE) 350 MG/ML injection 100 mL (100 mLs Intravenous Contrast Given 12/17/13 1413)   Filed Vitals:   12/17/13 1350 12/17/13 1430 12/17/13 1500  12/17/13 1523  BP:    124/51  Pulse: 67 70 74 59  Temp:      TempSrc:      Resp: 19 23 15 14   SpO2: 95% 95% 97% 100%   EKG noted normal sinus rhythm with a heart rate of 61 bpm. I-STAT Troponin negative elevation. Second i-STAT troponin negative elevation. BNP negative elevation. D-dimer mildly elevated at 0.59. BNP negative elevation. CBC unremarkable. BMP unremarkable. Hepatic function panel within normal limits. Urinalysis unremarkable-negative findings of infection. Urine pregnancy negative. Lipase negative elevation. Chest xray negative for acute abnormalities. Right knee plain film negative for acute osseous injury. CT angiogram of chest negative for acute pulmonary embolism. Ultrasound abdomen unremarkable for gallbladder infection-increased echogenicity most likely hepatic steatosis-otherwise unremarkable. Doubt PE. Doubt ACS. Doubt gallbladder infection. Doubt pancreatitis. Suspicion to be GERD. Unremarkable right knee imaging-suspicion to be right knee sprain. Patient stable, afebrile. Patient not septic appearing. Discharged patient. Discharge patient with Protonix. Discussed with patient to rest and stay hydrated. Discussed with patient proper diet. Discussed with patient to rest, ice, elevate. Referred patient to PCP, GI, orthopedics. Discussed with patient to closely monitor symptoms and if symptoms are to worsen or change to report back to the ED - strict return instructions given.  Patient agreed to plan of care, understood, all questions answered.   Raymon MuttonMarissa Remijio Holleran, PA-C 12/17/13 1714

## 2013-12-17 NOTE — ED Notes (Signed)
Patient transported to CT 

## 2013-12-17 NOTE — ED Notes (Signed)
Patient back from radiology Additional labs drawn, sent Patient talking on cell phone and appears in NAD Side rails up, call bell in reach

## 2013-12-17 NOTE — ED Notes (Signed)
MD at bedside. 

## 2013-12-17 NOTE — ED Notes (Signed)
Pt states chest pain x 3-4 days.  States pain is worse when lying down.  Starts in center of chest and radiates to above left breast.  Pt has had increased belching also.  Pt concerned for stomach cancer b/c mom recently diagnosed.  Pt also fell on rt knee on Friday.  Pain continues.

## 2013-12-17 NOTE — ED Notes (Signed)
Patient transported to X-ray 

## 2013-12-17 NOTE — ED Notes (Signed)
Patient ambulatory from triage without difficulty--steady gait, talking with visitors  Patient changing into hospital gown

## 2013-12-18 ENCOUNTER — Telehealth: Payer: Self-pay | Admitting: *Deleted

## 2013-12-18 NOTE — Telephone Encounter (Signed)
Pt aware of Pap results

## 2013-12-18 NOTE — ED Provider Notes (Signed)
Medical screening examination/treatment/procedure(s) were performed by non-physician practitioner and as supervising physician I was immediately available for consultation/collaboration.   EKG Interpretation None       Juliet RudeNathan R. Rubin PayorPickering, MD 12/18/13 [phone]

## 2013-12-18 NOTE — Telephone Encounter (Signed)
Message copied by Dyann KiefGIRALDEZ, Issam Carlyon M on Thu Dec 18, 2013  3:03 PM ------      Message from: Dessa PhiFUNCHES, JOSALYN      Created: Thu Dec 18, 2013 11:22 AM       Normal pap, repeat in 3 years ------

## 2013-12-19 LAB — CERVICOVAGINAL ANCILLARY ONLY: HERPES (WINDOWPATH): NEGATIVE

## 2013-12-23 ENCOUNTER — Telehealth: Payer: Self-pay | Admitting: Family Medicine

## 2013-12-23 NOTE — Telephone Encounter (Signed)
Patient needs to know her Lab test results from 10/2 in written. Please follow up with Patient. Patient ONLY speaks Spanish.

## 2013-12-24 NOTE — Telephone Encounter (Signed)
Unable to contact pt, not accepting call at this time

## 2014-01-16 ENCOUNTER — Other Ambulatory Visit: Payer: Self-pay | Admitting: *Deleted

## 2014-01-16 ENCOUNTER — Telehealth: Payer: Self-pay | Admitting: Family Medicine

## 2014-01-16 MED ORDER — METRONIDAZOLE 500 MG PO TABS: 500.0000 mg | ORAL_TABLET | Freq: Two times a day (BID) | ORAL | Status: DC

## 2014-01-16 MED ORDER — FLUCONAZOLE 150 MG PO TABS: 150.0000 mg | ORAL_TABLET | Freq: Every day | ORAL | Status: DC

## 2014-01-16 NOTE — Telephone Encounter (Signed)
Patient walked in requesting her results on paper because she needs this to file for paper work. Please follow up with pt.           

## 2014-01-20 NOTE — Telephone Encounter (Signed)
Patient walked in requesting her results on paper because she needs this to file for paper work. Please follow up with pt.

## 2014-01-20 NOTE — Telephone Encounter (Signed)
Labs printed and will be placed up front for pick up by my CMA.

## 2014-03-19 ENCOUNTER — Emergency Department (HOSPITAL_COMMUNITY): Payer: Medicaid Other

## 2014-03-19 ENCOUNTER — Emergency Department (HOSPITAL_COMMUNITY)
Admission: EM | Admit: 2014-03-19 | Discharge: 2014-03-19 | Disposition: A | Discharge status: Home / Self Care | Payer: Medicaid Other | Attending: Emergency Medicine | Admitting: Emergency Medicine

## 2014-03-19 ENCOUNTER — Encounter (HOSPITAL_COMMUNITY): Payer: Self-pay | Admitting: Emergency Medicine

## 2014-03-19 DIAGNOSIS — M79672 Pain in left foot: Secondary | ICD-10-CM | POA: Insufficient documentation

## 2014-03-19 DIAGNOSIS — R0602 Shortness of breath: Secondary | ICD-10-CM | POA: Diagnosis not present

## 2014-03-19 DIAGNOSIS — R079 Chest pain, unspecified: Secondary | ICD-10-CM | POA: Diagnosis present

## 2014-03-19 DIAGNOSIS — M79673 Pain in unspecified foot: Secondary | ICD-10-CM

## 2014-03-19 LAB — COMPREHENSIVE METABOLIC PANEL
ALBUMIN: 4 g/dL (ref 3.5–5.2)
ALT: 15 U/L (ref 0–35)
ANION GAP: 3 — AB (ref 5–15)
AST: 20 U/L (ref 0–37)
Alkaline Phosphatase: 67 U/L (ref 39–117)
BILIRUBIN TOTAL: 0.9 mg/dL (ref 0.3–1.2)
BUN: 12 mg/dL (ref 6–23)
CHLORIDE: 106 meq/L (ref 96–112)
CO2: 27 mmol/L (ref 19–32)
Calcium: 8.6 mg/dL (ref 8.4–10.5)
Creatinine, Ser: 0.42 mg/dL — ABNORMAL LOW (ref 0.50–1.10)
GFR calc non Af Amer: >90 mL/min (ref >90)
Glucose, Bld: 92 mg/dL (ref 70–99)
Potassium: 3.5 mmol/L (ref 3.5–5.1)
Sodium: 136 mmol/L (ref 135–145)
Total Protein: 8.3 g/dL (ref 6.0–8.3)

## 2014-03-19 LAB — CBC
HCT: 38.5 % (ref 36.0–46.0)
HEMOGLOBIN: 12.5 g/dL (ref 12.0–15.0)
MCH: 29.3 pg (ref 26.0–34.0)
MCHC: 32.5 g/dL (ref 30.0–36.0)
MCV: 90.4 fL (ref 78.0–100.0)
Platelets: 335 10*3/uL (ref 150–400)
RBC: 4.26 MIL/uL (ref 3.87–5.11)
RDW: 12.5 % (ref 11.5–15.5)
WBC: 10 10*3/uL (ref 4.0–10.5)

## 2014-03-19 LAB — I-STAT TROPONIN, ED: TROPONIN I, POC: 0 ng/mL (ref 0.00–0.08)

## 2014-03-19 LAB — LIPASE, BLOOD: LIPASE: 19 U/L (ref 11–59)

## 2014-03-19 MED ORDER — IBUPROFEN 800 MG PO TABS: 800.0000 mg | ORAL_TABLET | Freq: Three times a day (TID) | ORAL | Status: DC

## 2014-03-19 MED ORDER — HYDROCODONE-ACETAMINOPHEN 5-325 MG PO TABS
2.0000 | ORAL_TABLET | Freq: Once | ORAL | Status: AC
  Administered 2014-03-19: 2 via ORAL
  Filled 2014-03-19: qty 2

## 2014-03-19 NOTE — Discharge Instructions (Signed)
Dolor músculoesquelético °(Musculoskeletal Pain) °El dolor musculoesquelético se siente en huesos y músculos. El dolor puede ocurrir en cualquier parte del cuerpo. El profesional que lo asiste podrá tratarlo sin conocer la causa del dolor. Lo tratará aunque las pruebas de laboratorio (sangre y orina), las radiografías y otros estudios sean normales. La causa de estos dolores puede ser un virus.  °CAUSAS °Generalmente no existe una causa definida para este trastorno. También el malestar puede deberse a la actividad excesiva. En la actividad excesiva se incluye el hacer ejercicios físicos muy intensos cuando no se está en buena forma. El dolor de huesos también puede deberse a cambios climáticos. Los huesos son sensibles a los cambios en la presión atmosférica. °INSTRUCCIONES PARA EL CUIDADO DOMICILIARIO °· Para proteger su privacidad, no se entregarán los resultados de las pruebas por teléfono. Asegúrese de conseguirlos. Consulte el modo en que podrá obtenerlos si no se lo han informado. Es su responsabilidad contar con los resultados de las pruebas. °· Utilice los medicamentos de venta libre o de prescripción para el dolor, el malestar o la fiebre, según se lo indique el profesional que lo asiste. °Si le han administrado medicamentos, no conduzca, no opere maquinarias ni herramientas eléctricas, y tampoco firme documentos legales durante 24 horas. No beba alcohol. No tome píldoras para dormir ni otros medicamentos que puedan interferir en el tratamiento. °· Podrá seguir con todas las actividades a menos que éstas le ocasionen más dolor. Cuando el dolor disminuya, es importante que gradualmente reanude toda la rutina habitual. Retome las actividades comenzando lentamente. Aumente gradualmente la intensidad y la duración de sus actividades o del ejercicio. °· Durante los períodos de dolor intenso, el reposo en cama puede ser beneficioso. Recuéstese o siéntese en la posición que le sea más cómoda. °· Coloque hielo  sobre la zona afectada. °¨ Ponga hielo en una bolsa. °¨ Colóquese una toalla entre la piel y la bolsa de hielo. °¨ Aplique el hielo durante 10 a 20 minutos 3 ó 4 veces por día. °· Si el dolor empeora, o no desaparece puede ser necesario repetir las pruebas o realizar nuevos exámenes. El profesional que lo asiste podrá requerir investigar más profundamente para encontrar la causa posible. °SOLICITE ATENCIÓN MÉDICA DE INMEDIATO SI: °· Siente que el dolor empeora y no se alivia con los medicamentos. °· Siente dolor en el pecho asociado a falta de aire, sudoración, náuseas o vómitos. °· El dolor se localiza en el abdomen. °· Comienza a sentir nuevos síntomas que parecen ser diferentes o que lo preocupan. °ASEGÚRESE DE QUE:  °· Comprende las instrucciones para el alta médica. °· Controlará su enfermedad. °· Solicitará atención médica de inmediato según las indicaciones. °Document Released: 12/07/2004 Document Revised: 05/22/2011 °[employer]® Patient Information ©2015 [employer]. This information is not intended to replace advice given to you by your health care provider. Make sure you discuss any questions you have with your health care provider. ° °

## 2014-03-19 NOTE — ED Notes (Signed)
Patient transported to X-ray 

## 2014-03-19 NOTE — ED Notes (Signed)
Pt reports L heel and anterior lower leg pain for past several days. No posterior leg pain/swelling. Pt also reports L side chest pain that started yesterday. Hurts worse with deep breath. No SOB, dizziness, lightheadedness or nausea. Pt reports a HA and intermittent L arm numbness.

## 2014-03-20 NOTE — ED Provider Notes (Signed)
CSN: [id number]     Arrival date & time 03/19/14  1201 History   First MD Initiated Contact with Patient 03/19/14 1227     Chief Complaint  Patient presents with  . Leg Pain  . Chest Pain     (Consider location/radiation/quality/duration/timing/severity/associated sxs/prior Treatment) Patient is a 39 y.o. female presenting with leg pain and chest pain. The history is provided by the patient.  Leg Pain Location:  Knee and foot Injury: no   Knee location:  L knee Foot location:  Sole of L foot Pain details:    Quality:  Aching   Severity:  Moderate   Onset quality:  Gradual   Timing:  Constant   Progression:  Worsening Chronicity:  New Dislocation: no   Relieved by:  Nothing Worsened by:  Bearing weight Associated symptoms: no fever   Chest Pain Pain location:  L chest Pain quality: aching and sharp   Pain radiates to:  Does not radiate Pain severity:  Moderate Onset quality:  Sudden Duration:  2 days Timing:  Intermittent Progression:  Unchanged Chronicity:  New Context: at rest   Relieved by:  Nothing Worsened by:  Deep breathing and movement Associated symptoms: shortness of breath   Associated symptoms: no abdominal pain, no cough, no fever and not vomiting     History reviewed. No pertinent past medical history. History reviewed. No pertinent past surgical history. Family History  Problem Relation Age of Onset  . Cancer Mother     ovarian   . Depression Mother   . Diabetes Maternal Uncle   . Heart disease Maternal Uncle   . Heart disease Maternal Grandmother    History  Substance Use Topics  . Smoking status: Never Smoker   . Smokeless tobacco: Never Used  . Alcohol Use: Yes     Comment: occasional   OB History    No data available     Review of Systems  Constitutional: Negative for fever.  Respiratory: Positive for shortness of breath. Negative for cough.   Cardiovascular: Positive for chest pain.  Gastrointestinal: Negative for vomiting and  abdominal pain.  All other systems reviewed and are negative.     Allergies  Review of patient's allergies indicates no known allergies.  Home Medications   Prior to Admission medications   Medication Sig Start Date End Date Taking? Authorizing Provider  acetaminophen (TYLENOL) 325 MG tablet Take 650 mg by mouth every 6 (six) hours as needed (pain).   Yes Historical Provider, MD  ibuprofen (ADVIL,MOTRIN) 600 MG tablet Take 600 mg by mouth every 8 (eight) hours as needed (knee pain).   Yes Historical Provider, MD  fluconazole (DIFLUCAN) 150 MG tablet Take 1 tablet (150 mg total) by mouth daily. Patient not taking: Reported on 03/19/2014 01/16/14   Lora PaulaJosalyn C Funches, MD  ibuprofen (ADVIL,MOTRIN) 800 MG tablet Take 1 tablet (800 mg total) by mouth 3 (three) times daily. 03/19/14   Elwin MochaBlair Jerric Oyen, MD  metroNIDAZOLE (FLAGYL) 500 MG tablet Take 1 tablet (500 mg total) by mouth 2 (two) times daily. Patient not taking: Reported on 03/19/2014 01/16/14   Lora PaulaJosalyn C Funches, MD  pantoprazole (PROTONIX) 20 MG tablet Take 1 tablet (20 mg total) by mouth daily. Patient not taking: Reported on 03/19/2014 12/17/13   Marissa Sciacca, PA-C   BP 118/59 mmHg  Pulse 70  Temp(Src) 98 F (36.7 C) (Oral)  Resp 18  SpO2 95%  LMP 03/04/2014 Physical Exam  Constitutional: She is oriented to person, place, and time. She  appears well-developed and well-nourished. No distress.  HENT:  Head: Normocephalic and atraumatic.  Mouth/Throat: Oropharynx is clear and moist.  Eyes: EOM are normal. Pupils are equal, round, and reactive to light.  Neck: Normal range of motion. Neck supple.  Cardiovascular: Normal rate and regular rhythm.  Exam reveals no friction rub.   No murmur heard. Pulmonary/Chest: Effort normal and breath sounds normal. No respiratory distress. She has no wheezes. She has no rales. She exhibits tenderness (L chest, sternal).  Abdominal: Soft. She exhibits no distension. There is no tenderness. There is no  rebound.  Musculoskeletal: Normal range of motion. She exhibits no edema.       Legs:      Feet:  Neurological: She is alert and oriented to person, place, and time.  Skin: No rash noted. She is not diaphoretic.  Nursing note and vitals reviewed.   ED Course  Procedures (including critical care time) Labs Review Labs Reviewed  COMPREHENSIVE METABOLIC PANEL - Abnormal; Notable for the following:    Creatinine, Ser 0.42 (*)    Anion gap 3 (*)    All other components within normal limits  CBC  LIPASE, BLOOD  I-STAT TROPOININ, ED    Imaging Review Dg Chest 2 View  03/19/2014   CLINICAL DATA:  Shortness of breath a left-sided chest pain for 2-3 weeks.  EXAM: CHEST  2 VIEW  COMPARISON:  Single view of the chest and CT chest 12/17/2013.  FINDINGS: Heart size and mediastinal contours are within normal limits. Both lungs are clear. Visualized skeletal structures are unremarkable.  IMPRESSION: Negative exam.   Electronically Signed   By: Drusilla Kanner M.D.   On: 03/19/2014 13:56   Dg Knee Complete 4 Views Left  03/19/2014   CLINICAL DATA:  Proximal lower leg pain.  EXAM: LEFT KNEE - COMPLETE 4+ VIEW  COMPARISON:  None.  FINDINGS: Tiny marginal osteophytes on the patella. Joint effusion. Minimal medial joint space narrowing.  IMPRESSION: Minimal degenerative changes of the left knee.  Moderate effusion.   Electronically Signed   By: Geanie Cooley M.D.   On: 03/19/2014 14:02   Dg Foot Complete Left  03/19/2014   CLINICAL DATA:  Progressive medial heel pain.  EXAM: LEFT FOOT - COMPLETE 3+ VIEW  COMPARISON:  None.  FINDINGS: There is no fracture or dislocation or bone destruction. There is a tiny plantar calcaneal spur. Tiny osteophyte on the dorsal aspect of the proximal navicular. The bones are otherwise normal.  IMPRESSION: No significant abnormalities.   Electronically Signed   By: Geanie Cooley M.D.   On: 03/19/2014 13:56     EKG Interpretation   Date/Time:  Thursday March 19 2014  12:20:36 EST Ventricular Rate:  68 PR Interval:  159 QRS Duration: 88 QT Interval:  406 QTC Calculation: 432 R Axis:   23 Text Interpretation:  Sinus rhythm Low voltage, precordial leads No  significant change since last tracing Confirmed by Gwendolyn Grant  MD, Darl Kuss  ([id number]) on 03/19/2014 12:28:12 PM      MDM   Final diagnoses:  Shortness of breath  Heel pain    39 year old female here with chest pain, left leg pain. Chest pain:. Surgery yesterday. Hurts worse with a deep breath. No other shortness of breath, lightheaded, nausea. No previous cardiac disease. She is 38, no history of hypertension but is obese. She is PERC negative. She has reproducible chest tenderness. Negative troponin, normal chest x-ray. Instructed follow-up with PCP for chest pain. The leg pain: Worse with  standing. Tenderness palpation on left knee, superior anterior tibia. Also having left heel pain. No trauma. Believe this is likely due to being on her feet being obese. X-ray show joint effusion of the left knee no bony deformity. Left foot x-ray normal. Instructed to follow-up with PCP, given NSAIDs.   Elwin Mocha, MD 03/20/14 [phone]

## 2014-03-27 ENCOUNTER — Encounter: Payer: Self-pay | Admitting: Family Medicine

## 2014-03-27 ENCOUNTER — Ambulatory Visit: Payer: Medicaid Other | Attending: Family Medicine | Admitting: Family Medicine

## 2014-03-27 VITALS — BP 139/87 | HR 66 | Temp 98.0°F | Resp 16 | Ht 61.0 in | Wt 279.0 lb

## 2014-03-27 DIAGNOSIS — M722 Plantar fascial fibromatosis: Secondary | ICD-10-CM | POA: Insufficient documentation

## 2014-03-27 DIAGNOSIS — R6882 Decreased libido: Secondary | ICD-10-CM | POA: Diagnosis not present

## 2014-03-27 DIAGNOSIS — Z124 Encounter for screening for malignant neoplasm of cervix: Secondary | ICD-10-CM

## 2014-03-27 HISTORY — DX: Plantar fascial fibromatosis: M72.2

## 2014-03-27 HISTORY — DX: Decreased libido: R68.82

## 2014-03-27 LAB — TSH: TSH: 1.629 u[IU]/mL (ref 0.350–4.500)

## 2014-03-27 MED ORDER — DICLOFENAC SODIUM 75 MG PO TBEC: 75.0000 mg | DELAYED_RELEASE_TABLET | Freq: Two times a day (BID) | ORAL | Status: DC | PRN

## 2014-03-27 NOTE — Patient Instructions (Addendum)
Madison Flowers,  Thank you for coming in today.  1. L heel pain: Plantar fascitis Ice,  Diclofenac strong antiinflammatory to take instead of ibuprofen.  Referral to podiatry.  2. Low libido: Checking thyroid function and hormone levels.  If normal levels. Will start wellbutrin this is an antidepressant.   F/u in 4-6 weeks to discuss heel and libido   Dr. Armen PickupFunches    Plantar Fasciitis Plantar fasciitis is a common condition that causes foot pain. It is soreness (inflammation) of the band of tough fibrous tissue on the bottom of the foot that runs from the heel bone (calcaneus) to the ball of the foot. The cause of this soreness may be from excessive standing, poor fitting shoes, running on hard surfaces, being overweight, having an abnormal walk, or overuse (this is common in runners) of the painful foot or feet. It is also common in aerobic exercise dancers and ballet dancers. SYMPTOMS  Most people with plantar fasciitis complain of:  Severe pain in the morning on the bottom of their foot especially when taking the first steps out of bed. This pain recedes after a few minutes of walking.  Severe pain is experienced also during walking following a long period of inactivity.  Pain is worse when walking barefoot or up stairs DIAGNOSIS   Your caregiver will diagnose this condition by examining and feeling your foot.  Special tests such as X-rays of your foot, are usually not needed. PREVENTION   Consult a sports medicine professional before beginning a new exercise program.  Walking programs offer a good workout. With walking there is a lower chance of overuse injuries common to runners. There is less impact and less jarring of the joints.  Begin all new exercise programs slowly. If problems or pain develop, decrease the amount of time or distance until you are at a comfortable level.  Wear good shoes and replace them regularly.  Stretch your foot and the heel cords  at the back of the ankle (Achilles tendon) both before and after exercise.  Run or exercise on even surfaces that are not hard. For example, asphalt is better than pavement.  Do not run barefoot on hard surfaces.  If using a treadmill, vary the incline.  Do not continue to workout if you have foot or joint problems. Seek professional help if they do not improve. HOME CARE INSTRUCTIONS   Avoid activities that cause you pain until you recover.  Use ice or cold packs on the problem or painful areas after working out.  Only take over-the-counter or prescription medicines for pain, discomfort, or fever as directed by your caregiver.  Soft shoe inserts or athletic shoes with air or gel sole cushions may be helpful.  If problems continue or become more severe, consult a sports medicine caregiver or your own health care provider. Cortisone is a potent anti-inflammatory medication that may be injected into the painful area. You can discuss this treatment with your caregiver. MAKE SURE YOU:   Understand these instructions.  Will watch your condition.  Will get help right away if you are not doing well or get worse. Document Released: 11/22/2000 Document Revised: 05/22/2011 Document Reviewed: 01/22/2008 [facility] Patient Information 2015 [place]ExitCare, MarylandLLC. This information is not intended to replace advice given to you by your health care provider. Make sure you discuss any questions you have with your health care provider.

## 2014-03-27 NOTE — Assessment & Plan Note (Signed)
Normal pap

## 2014-03-27 NOTE — Progress Notes (Signed)
   Subjective:    Patient ID: Madison Flowers, female    DOB: 06/16/1975, 39 y.o.   MRN: [id number] CC: ED f/u L heel pain  HPI 39 yo F ED f/u heel pain:  1. L Heel pain: no injury. Pain on L heel and lateral ankle. No swelling or erythema. X-ray revealed calcaneal bone spur. Ibuprofen not helping. Tender to walk on her heel.   2. Decreased libido: x one year. Married x 13 years. Has trouble reaching orgasm. Admits that her mood is a bit down. Feels confident about. No trouble in her marriage.   Soc Hx: non smoker  Review of Systems As per HPI     Objective:   Physical Exam BP 139/87 mmHg  Pulse 66  Temp(Src) 98 F (36.7 C)  Resp 16  Ht 5\' 1"  (1.549 m)  Wt 279 lb (126.554 kg)  BMI 52.74 kg/m2  SpO2 100%  LMP 03/04/2014 General appearance: alert, cooperative, no distress and morbidly obese Extremities: L heel tender over plantar surface w/o erythema or edema. Full ROM of ankle.      Assessment & Plan:

## 2014-03-27 NOTE — Assessment & Plan Note (Addendum)
A: Low libido: P: Checking thyroid function and hormone levels.  If normal levels. Will start wellbutrin this is an antidepress  Addendum: Normal labs Sent in wellbutrin

## 2014-03-27 NOTE — Progress Notes (Signed)
Patient here to follow up from hospital visit for chest pain, left leg paint C/o left foot pain-sharp Low libido She is unable to work because of the pain Taking advil 800 mg for pain

## 2014-03-27 NOTE — Assessment & Plan Note (Signed)
A: L heel pain: Plantar fascitis P: Ice,  Diclofenac strong antiinflammatory to take instead of ibuprofen.  Referral to podiatry. ant.

## 2014-03-28 LAB — FSH/LH
FSH: 4.2 m[IU]/mL
LH: 4.2 m[IU]/mL

## 2014-03-30 ENCOUNTER — Telehealth: Payer: Self-pay | Admitting: *Deleted

## 2014-03-30 MED ORDER — BUPROPION HCL ER (XL) 150 MG PO TB24: 150.0000 mg | ORAL_TABLET | Freq: Every day | ORAL | Status: DC

## 2014-03-30 NOTE — Telephone Encounter (Signed)
-----   Message from Lora PaulaJosalyn C Funches, MD sent at 03/30/2014  2:03 PM EST ----- Normal hormone levels  Ordered Wellbutrin to on site pharmacy

## 2014-03-30 NOTE — Addendum Note (Signed)
Addended by: Dessa PhiFUNCHES, Chantell Kunkler on: 03/30/2014 02:05 PM   Modules accepted: Orders

## 2014-03-30 NOTE — Telephone Encounter (Signed)
Unable to contact Pt Not accepting call at this time  

## 2014-04-08 ENCOUNTER — Encounter (HOSPITAL_COMMUNITY): Payer: Self-pay | Admitting: Emergency Medicine

## 2014-04-08 ENCOUNTER — Emergency Department (HOSPITAL_COMMUNITY): Payer: Medicaid Other

## 2014-04-08 ENCOUNTER — Emergency Department (HOSPITAL_COMMUNITY)
Admission: EM | Admit: 2014-04-08 | Discharge: 2014-04-08 | Disposition: A | Discharge status: Home / Self Care | Payer: Medicaid Other | Attending: Emergency Medicine | Admitting: Emergency Medicine

## 2014-04-08 DIAGNOSIS — N390 Urinary tract infection, site not specified: Secondary | ICD-10-CM | POA: Diagnosis not present

## 2014-04-08 DIAGNOSIS — G5602 Carpal tunnel syndrome, left upper limb: Secondary | ICD-10-CM | POA: Diagnosis not present

## 2014-04-08 DIAGNOSIS — Z3202 Encounter for pregnancy test, result negative: Secondary | ICD-10-CM | POA: Insufficient documentation

## 2014-04-08 DIAGNOSIS — E669 Obesity, unspecified: Secondary | ICD-10-CM | POA: Insufficient documentation

## 2014-04-08 DIAGNOSIS — G8929 Other chronic pain: Secondary | ICD-10-CM | POA: Diagnosis not present

## 2014-04-08 DIAGNOSIS — Z79899 Other long term (current) drug therapy: Secondary | ICD-10-CM | POA: Insufficient documentation

## 2014-04-08 DIAGNOSIS — R51 Headache: Secondary | ICD-10-CM | POA: Insufficient documentation

## 2014-04-08 DIAGNOSIS — Z8739 Personal history of other diseases of the musculoskeletal system and connective tissue: Secondary | ICD-10-CM | POA: Insufficient documentation

## 2014-04-08 DIAGNOSIS — R200 Anesthesia of skin: Secondary | ICD-10-CM

## 2014-04-08 DIAGNOSIS — Z8719 Personal history of other diseases of the digestive system: Secondary | ICD-10-CM | POA: Insufficient documentation

## 2014-04-08 DIAGNOSIS — R202 Paresthesia of skin: Secondary | ICD-10-CM

## 2014-04-08 DIAGNOSIS — R110 Nausea: Secondary | ICD-10-CM

## 2014-04-08 HISTORY — DX: Plantar fascial fibromatosis: M72.2

## 2014-04-08 HISTORY — DX: Pelvic and perineal pain: R10.2

## 2014-04-08 HISTORY — DX: Other chronic pain: G89.29

## 2014-04-08 HISTORY — DX: Gastro-esophageal reflux disease without esophagitis: K21.9

## 2014-04-08 LAB — URINALYSIS, ROUTINE W REFLEX MICROSCOPIC
Bilirubin Urine: NEGATIVE
GLUCOSE, UA: NEGATIVE mg/dL
KETONES UR: NEGATIVE mg/dL
Leukocytes, UA: NEGATIVE
NITRITE: POSITIVE — AB
Protein, ur: 30 mg/dL — AB
Specific Gravity, Urine: 1.027 (ref 1.005–1.030)
Urobilinogen, UA: 1 mg/dL (ref 0.0–1.0)
pH: 7 (ref 5.0–8.0)

## 2014-04-08 LAB — BASIC METABOLIC PANEL
Anion gap: 6 (ref 5–15)
BUN: 9 mg/dL (ref 6–23)
CHLORIDE: 105 mmol/L (ref 96–112)
CO2: 28 mmol/L (ref 19–32)
Calcium: 8.8 mg/dL (ref 8.4–10.5)
Creatinine, Ser: 0.54 mg/dL (ref 0.50–1.10)
GFR calc non Af Amer: >90 mL/min (ref >90)
GLUCOSE: 103 mg/dL — AB (ref 70–99)
Potassium: 3.6 mmol/L (ref 3.5–5.1)
Sodium: 139 mmol/L (ref 135–145)

## 2014-04-08 LAB — CBC
HEMATOCRIT: 37.6 % (ref 36.0–46.0)
Hemoglobin: 12.1 g/dL (ref 12.0–15.0)
MCH: 29.3 pg (ref 26.0–34.0)
MCHC: 32.2 g/dL (ref 30.0–36.0)
MCV: 91 fL (ref 78.0–100.0)
PLATELETS: 333 10*3/uL (ref 150–400)
RBC: 4.13 MIL/uL (ref 3.87–5.11)
RDW: 12.4 % (ref 11.5–15.5)
WBC: 8.9 10*3/uL (ref 4.0–10.5)

## 2014-04-08 LAB — POC URINE PREG, ED: PREG TEST UR: NEGATIVE

## 2014-04-08 LAB — URINE MICROSCOPIC-ADD ON

## 2014-04-08 MED ORDER — NAPROXEN 500 MG PO TABS: 500.0000 mg | ORAL_TABLET | Freq: Two times a day (BID) | ORAL | Status: DC

## 2014-04-08 MED ORDER — NITROFURANTOIN MONOHYD MACRO 100 MG PO CAPS: 100.0000 mg | ORAL_CAPSULE | Freq: Two times a day (BID) | ORAL | Status: DC

## 2014-04-08 MED ORDER — ONDANSETRON 4 MG PO TBDP
4.0000 mg | ORAL_TABLET | Freq: Once | ORAL | Status: AC
  Administered 2014-04-08: 4 mg via ORAL
  Filled 2014-04-08: qty 1

## 2014-04-08 NOTE — ED Notes (Signed)
She is awake, alert and oriented x 3 with clear speech.  Dr. Roseanne RenoStewart has just entered her room.

## 2014-04-08 NOTE — Discharge Instructions (Signed)
Call for a follow up appointment with a Family or Primary Care Provider.  Call a neurologist for further evaluation of your carpal tunnel and possible further outpatient testing. Return if Symptoms worsen.   Take medication as prescribed.  Sndrome del tnel carpiano  (Carpal Tunnel Syndrome)  El tnel carpiano es un espacio estrecho ubicado en el lado palmar de la [place]. Est formado por los huesos de la [place] y los ligamentos. Los nervios, vasos sanguneos y tendones pasan a travs del tnel carpiano. Los movimientos de la [place] o ciertas enfermedades pueden causar hinchazn del tnel. Esta hinchazn comprime el nervio principal en la mueca ((nervio mediano) y ocasiona un trastorno doloroso que se denomina sndrome del tnel carpiano. CAUSAS   Movimientos repetidos de [employer]  Lesiones en la [place].  Ciertas enfermedades como la artritis, la diabetes, el alcoholismo, el hipertiroidismo o la insuficiencia renal.  Obesidad.  Embarazo. SNTOMAS   Sensacin de "pinchazos" en los dedos o la mano.  Hormigueo o entumecimiento en los dedos o en la mano.  Sensacin dolorosa en todo el brazo.  Dolor en la mueca que sube por el brazo hasta el hombro.  Dolor que baja por la mano o los dedos.  Sensacin de [employer]. DIAGNSTICO  El mdico le har una historia clnica y un examen fsico. Puede ser necesario hacer un electromiograma. Esta prueba mide las seales elctricas enviadas por los msculos. Generalmente el paso de las seales elctricas es impedido por el sndrome del tnel carpiano. Tambin puede ser necesario que le tomen radiografas.  TRATAMIENTO  El sndrome del tnel carpiano puede curarse espontneamente sin tratamiento. El [employer] indicar el uso de un cabestrillo para la [place] o que tome medicamentos como antiinflamatorios no esteroides. Las inyecciones de cortisona pueden ayudar. A veces es necesaria la ciruga para liberar el nervio comprimido.    INSTRUCCIONES PARA EL CUIDADO EN EL HOGAR   Tome todos los medicamentos segn le indic su mdico. Slo tome medicamentos de venta libre o recetados para Primary school teacher, las molestias o bajar la fiebre segn las indicaciones de su mdico.  Si le aconsejaron usar un cabestrillo para evitar que la [place] se doble, selo como le indicaron. Es importante que use el cabestrillo durante la noche. selo mientras sienta dolor o adormecimiento en la mano, el brazo o la [place]. Esto puede durar entre 1 y 2 meses.  Haga reposar la [place] de toda actividad que le cause dolor. Si sus sntomas estn relacionados con Kathie Dike, deber conversar con su empleador acerca de la posibilidad de cambiar a una tarea que no requiera el uso de la [place].  Aplique hielo en la mueca despus de los perodos prolongados de [place].  Ponga el hielo en una bolsa plstica.  Colquese una toalla entre la piel y la bolsa de hielo.  Deje el hielo en el lugar durante 15 a 20 minutos, 3 a 4 veces por da.  Cumpla con todas las visitas de control, segn le indique su mdico. Aqu se incluyen derivaciones a un ortopedista, fisioterapia y rehabilitacin. Gildardo Griffes en obtener la asistencia necesaria puede dar como resultado una demora o fracaso en la curacin. SOLICITE ATENCIN MDICA DE INMEDIATO SI:   Desarrolla nuevos e inexplicables sntomas.  Los sntomas actuales empeoran y la medicacin no los [place]. ASEGRESE DE QUE:   Comprende estas instrucciones.  Controlar su enfermedad.  Solicitar ayuda de inmediato si no mejora o si empeora. Document Released: 02/27/2005 Document Revised: 11/22/2011 ExitCare  Patient Information 2015 [place]ExitCare, [place]LLC. This information is not intended to replace advice given to you by your health care provider. Make sure you discuss any questions you have with your health care provider.   Bacteriuria asintomtica (Asymptomatic Bacteriuria) La bacteriuria asintomtica es la  presencia de un gran nmero de bacterias en la orina, sin los sntomas habituales de ardor o deseos frecuentes de Geographical information systems officerorinar. Los siguientes factores aumentan el riesgo de bacteriuria asintomtica:  Diabetes mellitus.  Edad avanzada.  Embarazo en Designer, fashion/clothingel primer trimestre.  Clculos en el rin.  Trasplantes de rin.  Filtracin desde la vejiga hacia el rin por falla valvular en los nios pequeos (reflujo). En la Franklin Resourcesmayora de las personas, no se necesita tratamiento para Copyesta afeccin, y Psychologist, educationalhacerlo puede derivar en otros problemas, como proliferacin de hongos y crecimiento de bacterias resistentes. Sin embargo, en algunos casos, como en las mujeres Maconembarazadas, se requiere tratamiento para prevenir una infeccin renal. La bacteriuria asintomtica en el embarazo tambin se asocia a una restriccin del desarrollo fetal, parto prematuro y Paradisemuerte del recin nacido. INSTRUCCIONES PARA EL CUIDADO EN EL HOGAR Controle su afeccin para ver si hay cambios. Las siguientes indicaciones pueden ayudar a Psychologist, educationalaliviar cualquier Longs Drug Storesmolestia que pueda sentir:  Beba gran cantidad de lquido para mantener la orina de tono claro o color amarillo plido. Vaya al bao con ms frecuencia para mantener la vejiga vaca.  Mantenga limpia la zona que rodea la vagina y Administratorel recto. Higiencese de adelante hacia atrs despus de orinar. SOLICITE ATENCIN MDICA DE INMEDIATO SI:  Tiene signos de infeccin como:  Ardor al Geographical information systems officerorinar.  Miccin frecuente.  Dolor de espalda.  Grant RutsFiebre.  Observa sangre en la orina.  Tiene fiebre. ASEGRESE DE QUE:  Comprende estas instrucciones.  Controlar su afeccin.  Recibir ayuda de inmediato si no mejora o si empeora. Document Released: 02/27/2005 Document Revised: 07/14/2013 [facility] LLCExitCare Patient Information 2015 [place]ExitCare, MarylandLLC. This information is not intended to replace advice given to you by your health care provider. Make sure you discuss any questions you have with your health care  provider.

## 2014-04-08 NOTE — ED Notes (Signed)
Stuck pt x2 not enough blood.

## 2014-04-08 NOTE — ED Provider Notes (Signed)
Patient care assumed from Madison DrownLauren Parker, PA-C at shift change. Please see her note for further.  This is a 39 year old female with a past medical history of reflux, chronic pelvic pain, obesity presenting emergency room chief complaint of left upper and facial numbness since last night.  She also reports nausea and vomiting. Patient has negative head CT. Patient diagnosed with carpal tunnel and evaluated by Dr. Roseanne Flowers. At the time of shift change the patient is only awaiting urinalysis results. Patient has nitrite positive UA with many bacteria. The patient denies any urinary symptoms. After discussing with Dr. Cherylynn Flowers will treat with Macrobid and send urine for culture. Patient provided with prescriptions for Naprosyn and Macrobid. I advised the patient to follow-up with their primary care provider this week and with Madison Flowers neurology.  I advised the patient to return to the emergency department with new or worsening symptoms or new concerns. The patient verbalized understanding and agreement with plan.      Madison ChambersWilliam Duncan [name], PA-C 04/08/14 1754  Audree CamelScott T Goldston, MD 04/09/14 [phone]

## 2014-04-08 NOTE — ED Notes (Signed)
Pt c/o dizziness/left-sided numbness/emesis starting yesterday. Denies fevers/chills/diarrhea/abdominal pain. Denies cardiac or CVA history. Denies any new medications or anything abnormal precipitating factors yesterday. Denies blurry vision. Hx hypertension. Neurologically intact. Speaking full/clear sentences. RR even/unlabored. No asymmetry noted.

## 2014-04-08 NOTE — ED Provider Notes (Signed)
CSN: [id number]     Arrival date & time 04/08/14  1147 History   First MD Initiated Contact with Patient 04/08/14 1333     Chief Complaint  Patient presents with  . Dizziness  . Headache  . Numbness    Left sided  . Emesis     (Consider location/radiation/quality/duration/timing/severity/associated sxs/prior Treatment) HPI Comments: The patient is a 39 year old female with a past medical history of reflux, chronic pelvic pain, obesity presenting emergency room chief complaint of left upper and facial numbness since last night. Patient reports intermittent paresthesias in left upper extremity and head. She reports discomfort starts at hand and radiates into the upper extremity and face. These episodes occur approximately every 10 minutes, self resolved after several seconds. Patient denies weakness, headache, neck pain, fever, other neurologic deficits. Patient also complains of nausea with emesis since last night. The patient also complains of dizziness, intermittent for several months. Patient denies dizziness at this time.  Patient is a 39 y.o. female presenting with dizziness, headaches, and vomiting. The history is provided by the patient. No language interpreter was used.  Dizziness Associated symptoms: headaches, nausea and vomiting   Headache Associated symptoms: dizziness, nausea, numbness and vomiting   Associated symptoms: no abdominal pain, no fever, no neck pain and no neck stiffness   Emesis Associated symptoms: headaches   Associated symptoms: no abdominal pain and no chills     Past Medical History  Diagnosis Date  . Plantar fasciitis   . GERD (gastroesophageal reflux disease)   . Chronic pelvic pain in female    History reviewed. No pertinent past surgical history. Family History  Problem Relation Age of Onset  . Cancer Mother     ovarian   . Depression Mother   . Diabetes Maternal Uncle   . Heart disease Maternal Uncle   . Heart disease Maternal Grandmother     History  Substance Use Topics  . Smoking status: Never Smoker   . Smokeless tobacco: Never Used  . Alcohol Use: Yes     Comment: occasional   OB History    No data available     Review of Systems  Constitutional: Negative for fever and chills.  Gastrointestinal: Positive for nausea and vomiting. Negative for abdominal pain.  Musculoskeletal: Negative for neck pain and neck stiffness.  Neurological: Positive for dizziness, numbness and headaches.      Allergies  Review of patient's allergies indicates no known allergies.  Home Medications   Prior to Admission medications   Medication Sig Start Date End Date Taking? Authorizing Provider  acetaminophen (TYLENOL) 325 MG tablet Take 650 mg by mouth every 6 (six) hours as needed (pain).    Historical Provider, MD  buPROPion (WELLBUTRIN XL) 150 MG 24 hr tablet Take 1 tablet (150 mg total) by mouth daily. 03/30/14   Lora Paula, MD  diclofenac (VOLTAREN) 75 MG EC tablet Take 1 tablet (75 mg total) by mouth 2 (two) times daily as needed (with food). 03/27/14   Josalyn C Funches, MD   BP 149/71 mmHg  Pulse 75  Temp(Src) 97.3 F (36.3 C) (Oral)  SpO2 100%  LMP 04/08/2014 Physical Exam  Constitutional: She is oriented to person, place, and time. She appears well-developed and well-nourished. No distress.  HENT:  Head: Normocephalic and atraumatic.  Eyes: Conjunctivae and EOM are normal. Pupils are equal, round, and reactive to light.  Neck: Normal range of motion. Neck supple. No spinous process tenderness and no muscular tenderness present. No rigidity.  Cardiovascular: Normal rate and regular rhythm.   Pulmonary/Chest: Effort normal. No respiratory distress. She has no wheezes. She has no rales. She exhibits no tenderness.  Abdominal: Soft. She exhibits no distension and no mass. There is no tenderness. There is no rebound and no guarding.  Musculoskeletal:  Negative spurling's test  Neurological: She is alert and  oriented to person, place, and time.  Speech is clear and goal oriented, follows commands II-Visual fields were normal.   III/IV/VI-Pupils were equal and reacted. Extraocular movements were full and conjugate.  V/VII-no facial droop.   VIII-normal.   Motor: Strength 5/5 to upper and lower extremities bilaterally. Moves all 4 extremities equally. Sensory: normal sensation to upper and lower extremities.  Cerebellar: Normal finger to nose bilaterally No pronator drift. Able to re apply take with left upper extremity.  Skin: Skin is warm and dry. She is not diaphoretic.  Psychiatric: She has a normal mood and affect. Her behavior is normal.  Nursing note and vitals reviewed.   ED Course  Procedures (including critical care time) Labs Review Labs Reviewed  BASIC METABOLIC PANEL - Abnormal; Notable for the following:    Glucose, Bld 103 (*)    All other components within normal limits  CBC  URINALYSIS, ROUTINE W REFLEX MICROSCOPIC  POC URINE PREG, ED    Imaging Review Ct Head Wo Contrast  04/08/2014   CLINICAL DATA:  Left-sided headache.  Facial numbness.  EXAM: CT HEAD WITHOUT CONTRAST  TECHNIQUE: Contiguous axial images were obtained from the base of the skull through the vertex without intravenous contrast.  COMPARISON:  None.  FINDINGS: There is no evidence of mass effect, midline shift or extra-axial fluid collections. There is no evidence of a space-occupying lesion or intracranial hemorrhage. There is no evidence of a cortical-based area of acute infarction.  The ventricles and sulci are appropriate for the patient's age. The basal cisterns are patent.  Visualized portions of the orbits are unremarkable. The visualized portions of the paranasal sinuses and mastoid air cells are unremarkable.  The osseous structures are unremarkable.  IMPRESSION: Normal CT of the brain without intravenous contrast.   Electronically Signed   By: Elige KoHetal  Patel   On: 04/08/2014 15:04     EKG  Interpretation None      MDM   Final diagnoses:  Numbness and tingling in left arm  Carpal tunnel syndrome of left wrist  Nausea  UTI (lower urinary tract infection)    Patient is a 39 year-old presenting with left upper extremity paresthesias, intermittent multiple episodes no neurodeficits on exam. Episode occurred during evaluation for range of motion able to grasp small objects without difficulty. Discussed patient condition with Dr. Roseanne RenoStewart after his encounter advises treatment for carpal tunnel, neurology follow-up as an outpatient. Awaiting UA due to nausea. Patient care assumed by [facility], PA-C at shift change. To follow up with UA.   Mellody DrownLauren Joie Reamer, PA-C 04/09/14 1708  Samuel JesterKathleen McManus, DO 04/11/14 [id number]

## 2014-04-10 LAB — URINE CULTURE

## 2014-04-12 ENCOUNTER — Telehealth (HOSPITAL_COMMUNITY): Payer: Self-pay

## 2014-04-12 NOTE — Telephone Encounter (Signed)
Post ED Visit - Positive Culture Follow-up  Culture report reviewed by antimicrobial stewardship pharmacist: []  Wes Dulaney, Pharm.D., BCPS []  Celedonio MiyamotoJeremy Frens, Pharm.D., BCPS [x]  Georgina PillionElizabeth Martin, Pharm.D., BCPS []  Lynn CenterMinh Pham, [address]Ph[address]D., BCPS, AAHIVP []  Estella HuskMichelle Turner, Pharm.D., BCPS, AAHIVP []  Elder CyphersLorie Poole, [address]Pharm[address]D., BCPS  Positive Urine culture, >/= 100,000 colonies -> E Coli Treated with Nitrofurantoin, organism sensitive to the same and no further patient follow-up is required at this time.  Arvid RightClark, Catia Todorov Dorn 04/12/2014, 11:02 PM

## 2014-06-08 ENCOUNTER — Telehealth: Payer: Self-pay | Admitting: Family Medicine

## 2014-06-08 NOTE — Telephone Encounter (Signed)
Patient came into facility to inquire about the podiatry referral, please f/u with pt.

## 2015-01-17 ENCOUNTER — Emergency Department (HOSPITAL_COMMUNITY)
Admission: EM | Admit: 2015-01-17 | Discharge: 2015-01-17 | Disposition: A | Discharge status: Home / Self Care | Payer: Medicaid Other | Attending: Physician Assistant | Admitting: Physician Assistant

## 2015-01-17 ENCOUNTER — Encounter (HOSPITAL_COMMUNITY): Payer: Self-pay | Admitting: Emergency Medicine

## 2015-01-17 ENCOUNTER — Emergency Department (HOSPITAL_COMMUNITY): Payer: Medicaid Other

## 2015-01-17 DIAGNOSIS — S6992XA Unspecified injury of left wrist, hand and finger(s), initial encounter: Secondary | ICD-10-CM | POA: Insufficient documentation

## 2015-01-17 DIAGNOSIS — S8991XA Unspecified injury of right lower leg, initial encounter: Secondary | ICD-10-CM | POA: Diagnosis not present

## 2015-01-17 DIAGNOSIS — Y998 Other external cause status: Secondary | ICD-10-CM | POA: Insufficient documentation

## 2015-01-17 DIAGNOSIS — Z792 Long term (current) use of antibiotics: Secondary | ICD-10-CM | POA: Insufficient documentation

## 2015-01-17 DIAGNOSIS — G8929 Other chronic pain: Secondary | ICD-10-CM | POA: Diagnosis not present

## 2015-01-17 DIAGNOSIS — W010XXA Fall on same level from slipping, tripping and stumbling without subsequent striking against object, initial encounter: Secondary | ICD-10-CM | POA: Insufficient documentation

## 2015-01-17 DIAGNOSIS — Y9389 Activity, other specified: Secondary | ICD-10-CM | POA: Insufficient documentation

## 2015-01-17 DIAGNOSIS — Z791 Long term (current) use of non-steroidal anti-inflammatories (NSAID): Secondary | ICD-10-CM | POA: Diagnosis not present

## 2015-01-17 DIAGNOSIS — Z8719 Personal history of other diseases of the digestive system: Secondary | ICD-10-CM | POA: Insufficient documentation

## 2015-01-17 DIAGNOSIS — M79642 Pain in left hand: Secondary | ICD-10-CM

## 2015-01-17 DIAGNOSIS — W19XXXA Unspecified fall, initial encounter: Secondary | ICD-10-CM

## 2015-01-17 DIAGNOSIS — Y9289 Other specified places as the place of occurrence of the external cause: Secondary | ICD-10-CM | POA: Diagnosis not present

## 2015-01-17 DIAGNOSIS — M25561 Pain in right knee: Secondary | ICD-10-CM

## 2015-01-17 MED ORDER — IBUPROFEN 800 MG PO TABS: 800.0000 mg | ORAL_TABLET | Freq: Three times a day (TID) | ORAL | Status: DC

## 2015-01-17 MED ORDER — IBUPROFEN 800 MG PO TABS
800.0000 mg | ORAL_TABLET | Freq: Once | ORAL | Status: AC
  Administered 2015-01-17: 800 mg via ORAL
  Filled 2015-01-17: qty 1

## 2015-01-17 NOTE — Discharge Instructions (Signed)
1. Medications: ibuprofen, usual home medications 2. Treatment: rest, drink plenty of fluids, ice, elevate, wear knee sleeve 3. Follow Up: please followup with your primary doctor for discussion of your diagnoses and further evaluation after today's visit; if you do not have a primary care doctor use the resource guide provided to find one; please return to the ER for severe pain, numbness, weakness, new or worsening symptoms   Cmo usar Pollyann Glen (How to Use a Knee Brace) Neomia Dear rodillera es un dispositivo que se [place] para brindar sujecin a la rodilla, especialmente durante un perodo de recuperacin despus de una lesin o [place]. Hay varios tipos de rodilleras. Algunas estn diseadas para evitar las lesiones (rodilleras de prevencin). A menudo se usan durante la prctica de deportes. Otras sirven para estabilizar una rodilla lesionada (rodillera funcional) o para mantenerla inmvil mientras se recupera de una lesin (rodillera para rehabilitacin). Las [employer] tienen artritis grave de rodilla pueden beneficiarse con una rodillera que quita algo de presin de la rodilla (rodillera de descarga). La mayora de las rodilleras estn fabricadas con una combinacin de tela y metal o plstico.  Es posible que deba usar una rodillera para lo siguiente:  Engineer, materials de rodilla.  Ayudar a la rodilla a Engineer, site (mejorar la estabilidad).  Poder caminar distancias ms largas (mejorar la movilidad).  Evitar las lesiones.  Brindar estabilidad a la rodilla mientras se recupera de [place] o una lesin. RIESGOS Y COMPLICACIONES Generalmente, las rodilleras son muy seguras de usar. Sin embargo, pueden Teacher, music, por ejemplo:  Irritacin de la piel que puede derivar en una infeccin.  Agravar la afeccin si la rodillera se [place] de un modo incorrecto. CMO USAR UNA RODILLERA Las diferentes rodilleras tendrn distintas instrucciones de [place]. El Loss adjuster, chartered o Therapist, music lo siguiente:  Cmo English as a second language teacher.  Cmo ajustarla.  Cundo y con qu frecuencia usar la rodillera.  Cmo quitrsela.  Si necesita dispositivos de [employer] de la rodillera, como muletas o un bastn. En general, la rodillera:  Debe tener la bisagra alineada con la flexin de la rodilla.  Debe tener correas, sistemas de cierre o cintas que se ajusten bien alrededor de la rodilla.  No debe estar muy ajustada ni muy floja. CMO CUIDAR UNA RODILLERA  Revise frecuentemente la rodillera para detectar signos de dao, por ejemplo, conexiones o accesorios sueltos. Durante el uso normal, la rodillera puede daarse o Acupuncturist.  Lave las partes de tela de la rodillera con agua y [place].  Lea las instrucciones de uso que se adjuntan con la rodillera para Solicitor otras indicaciones especficas respecto de los cuidados. SOLICITE ATENCIN MDICA SI:  La rodillera est muy floja o [place], y no logra acomodarla correctamente.  La rodillera le produce enrojecimiento, hinchazn, hematomas o irritacin en la piel.  La rodillera no resulta eficaz.  La rodillera est intensificando el dolor de rodilla.   Esta informacin no tiene Theme park manager el consejo del mdico. Asegrese de hacerle al mdico cualquier pregunta que tenga.   Document Released: 06/15/2008 Document Revised: 11/18/2014 Elsevier Interactive Patient Education 2016 [employer].  Dolor de rodilla (Knee Pain) El dolor de rodilla es un sntoma muy comn y puede tener muchas causas. Suele desaparecer cuando se siguen las indicaciones del mdico en lo que respecta a Engineer, materials y las molestias en la casa. Sin embargo, puede Scientist, product/process development en una afeccin que requiere [place]. Algunas afecciones pueden incluir lo  siguiente:  Artritis por uso y desgaste (artrosis).  Artritis por hinchazn e irritacin (artritis reumatoide o gota).  Un quiste o un crecimiento en  la rodilla.  Una infeccin en la articulacin de la rodilla.  Una lesin que no se [place].  Dao, hinchazn o irritacin de los tejidos que sostienen la rodilla (distensin de ligamentos o tendinitis). Si el dolor de Paramedic, tal vez haya que realizar ms estudios para Scientist, forensic, los cuales pueden incluir radiografas u otros estudios de diagnstico por imgenes de la rodilla. Adems, es posible que haya que extraerle lquido de la rodilla. El tratamiento del dolor continuo de rodilla depende de la causa, pero puede incluir lo siguiente:  Medicamentos para Engineer, materials o reducir la hinchazn.  Inyecciones de corticoides en la rodilla.  Fisioterapia.  Ciruga. INSTRUCCIONES PARA EL CUIDADO EN EL HOGAR  Tome los medicamentos solamente como se lo haya indicado el mdico.  Mantenga la rodilla en reposo y en alto (elevada) mientras est descansando.  No haga cosas que le causen dolor o que lo intensifiquen.  Evite las actividades o los ejercicios de alto impacto, como correr, Public relations account executive soga o hacer saltos de tijera.  Aplique hielo sobre la zona de la rodilla:  Ponga el hielo en una bolsa plstica.  Coloque una toalla entre la piel y la bolsa de hielo.  Coloque el hielo durante 20 minutos, 2 a 3 veces por da.  Pregntele al mdico si debe usar una Neurosurgeon.  Cuando duerma, pngase una almohada debajo de la rodilla.  Baje de peso si es necesario. [place] extra puede generar presin en la rodilla.  No consuma ningn producto que contenga tabaco, lo que incluye cigarrillos, tabaco de Theatre manager o Administrator, Civil Service. Si necesita ayuda para dejar de fumar, consulte al mdico. Fumar puede retrasar la curacin de cualquier problema que tenga en el hueso y Nurse, learning disability. SOLICITE ATENCIN MDICA SI:  El dolor de rodilla contina, [place].  Tiene fiebre junto con dolor de rodilla.  La rodilla se le tuerce o se le traba.  La rodilla  est ms hinchada. SOLICITE ATENCIN MDICA DE INMEDIATO SI:   La articulacin de la rodilla est caliente al tacto.  Tiene dolor en el pecho o dificultad para respirar.   Esta informacin no tiene Theme park manager el consejo del mdico. Asegrese de hacerle al mdico cualquier pregunta que tenga.   Document Released: 08/16/2007 Document Revised: 03/20/2014 Elsevier Interactive Patient Education [employer]   Emergency Department Resource Guide 1) Find a Doctor and Pay Out of Pocket Although you won't have to find out who is covered by your insurance plan, it is a good idea to ask around and get recommendations. You will then need to call the office and see if the doctor you have chosen will accept you as a new patient and what types of options they offer for patients who are self-pay. Some doctors offer discounts or will set up payment plans for their patients who do not have insurance, but you will need to ask so you aren't surprised when you get to your appointment.  2) Contact Your Local Health Department Not all health departments have doctors that can see patients for sick visits, but many do, so it is worth a call to see if yours does. If you don't know where your local health department is, you can check in your phone book. The CDC also has a tool to help you locate your  state's health department, and many state websites also have listings of all of their local health departments.  3) Find a Walk-in Clinic If your illness is not likely to be very severe or complicated, you may want to try a walk in clinic. These are popping up all over the country in pharmacies, drugstores, and shopping centers. They're usually staffed by nurse practitioners or physician assistants that have been trained to treat common illnesses and complaints. They're usually fairly quick and inexpensive. However, if you have serious medical issues or chronic medical problems, these are probably not your  best option.  No Primary Care Doctor: - Call Health Connect at  [phone] - they can help you locate a primary care doctor that  accepts your insurance, provides certain services, etc. - Physician Referral Service- [phone]  Chronic Pain Problems: Organization         Address  Phone   Notes  [facility]  [phone] Patients need to be referred by their primary care doctor.   Medication Assistance: Organization         Address  Phone   Notes  [place] [facility] [address] [place], [place] [address] [phone] --Must be a resident of [facility] [place] -- Must have NO insurance coverage whatsoever (no Medicaid/ Medicare, etc.) -- The pt. MUST have a primary care doctor that directs their care regularly and follows them in the community   MedAssist  [phone]   [employer]  [phone]    Agencies that provide inexpensive medical care: Organization         Address  Phone   Notes  [facility]  [phone]   [facility]    [phone]   [facility] [address] [place], [place] [address] [phone]   [facility] [address], [place] [phone]   Planned Parenthood    [phone]   [facility]    [phone]   Community Health and [facility]  [address], [place] Phone:  [phone], Fax:  [phone] Hours of Operation:  9 am - 6 pm, M-F.  Also accepts Medicaid/Medicare and self-pay.  [facility]  [address], [place] Phone: [phone], Fax: [phone]. Hours of Operation:  8:30 am - 5:30 pm, M-F.  Also accepts Medicaid and self-pay.  [facility] [place] [address], [place] Phone: [phone]   [facility] [address], [place], [place] [phone], Ext. [phone] Mondays & Thursdays: 7-9 AM.  First 15  patients are seen on a first come, first serve basis.    Medicaid-accepting [facility] Providers:  Organization         Address  Phone   Notes  [facility] [address], [place] [phone] Also accepts self-pay patients.  [facility] [address], [place]  [phone]   [facility] [address], [place] [phone]   [facility] [address] [place] [address], [place] [phone]   Renaye RakersVeita Bland [address], [place]   [phone] Only accepts [place] Access [place] patients after they have their name applied to their card.   Self-Pay (no insurance) in [facility]:  Organization  Address  Phone   Notes  Sickle Cell Patients, [facility] [place] [phone]   [facility] [place] [phone]   [facility] [place]  [place], [address],  [phone]   [facility]/Dr. Osei-Bonsu  [address], [place] or [address], [place] [phone] Phone number for both [place] and [place] locations is the same.  [facility] and [facility] [address] [place] [phone]   [facility] [address], [place] or [address] [phone]   [facility] [address], [place] [phone], phone; [phone], fax Sees patients 1st and 3rd Saturday of every month.  Must not qualify for public or private insurance (i.e. Medicaid, Medicare, [place] Health Choice, Veterans' Benefits)  Household income should be no more than 200% of the poverty level The clinic cannot treat you if you are pregnant or think you are pregnant  Sexually transmitted diseases are not treated at the clinic.    Dental  Care: Organization         Address  Phone  Notes  [facility] Department of [facility] [place] [phone] Accepts children up to age 64 who are enrolled in [place] or [place]; pregnant women with a Medicaid card; and children who have applied for Medicaid or [place] Health Choice, but were declined, whose parents can pay a reduced fee at time of service.  [facility] Department of [facility]  [address], [place] [phone] Accepts children up to age 72 who are enrolled in [place] or [place]; pregnant women with a Medicaid card; and children who have applied for Medicaid or [place] Health Choice, but were declined, whose parents can pay a reduced fee at time of service.  [place] Adult Dental Access PROGRAM  [place] [phone] Patients are seen by appointment only. Walk-ins are not accepted. [place] will see patients 71 years of age and older. Monday - Tuesday (8am-5pm) Most Wednesdays (8:30-5pm) $30 per visit, cash only  [facility] Adult Dental Access PROGRAM  [address], [facility] [phone] Patients are seen by appointment only. Walk-ins are not accepted. [place] will see patients 57 years of age and older. One Wednesday Evening (Monthly: Volunteer Based).  $30 per visit, cash only  [employer]  [phone] for adults; Children under age 30, call Graduate Pediatric Dentistry at [phone]. Children aged 43-14, please call [phone] to request a pediatric application.  Dental services are provided in all areas of dental care including fillings, crowns and bridges, complete and partial dentures, implants, gum treatment, root canals, and extractions. Preventive care is also provided. Treatment is provided to both adults and children. Patients are selected via a lottery and there is often a waiting list.   [facility] [address] [place]  [phone] [email or web address]   [facility] [address] [place], [place] [phone], Ext. [phone] Second and Fourth Thursday of each month, opens at 6:30 AM; Clinic ends at 9 AM.  Patients are seen on a first-come first-served basis, and a limited number are seen during each clinic.   [facility]  [address] [place], [place]  [place], [place] [phone]   Eligibility Requirements You must have lived in [place], [place], or [place] counties for at least the last three months.   You cannot be eligible for state or federal sponsored [employer], including [employer], [place], or [employer].   You generally cannot be eligible for healthcare insurance through your employer.    How to apply: Eligibility screenings are held every Tuesday and Wednesday afternoon from 1:00 pm until 4:00 pm. You do not need an appointment for the interview!  [facility] [address], [place], [place]   [place]  [place] Department  [place]  [phone]    Behavioral Health Resources in the Community: Intensive Outpatient Programs Organization         Address  Phone  Notes  Newtonia Grant. [address] [place], [place] [phone]   [facility] [address], [place], [place]   ADS: [facility] [address] [place], [place]   [place] [address],  [place], [place] or [phone]   Substance Abuse Resources Organization         Address  Phone  Notes  Alcohol and Drug Services  [phone]   [place]  [phone]   The [place]   Chinita Pester  [phone]   Residential & Outpatient Substance Abuse Program  [phone]    Psychological Services Organization         Address  Phone  Notes  [facility] [place]  [place]  [phone]   [place] [address], [place] or [phone]    Mobile Crisis Teams Organization         Address  Phone  Notes  [facility], Mobile Crisis Care Unit  [phone]   [facility]  [address] [place], [place]   [place] Levels [address] [place] [phone]    Self-Help/Support Groups Organization         Address  Phone             Notes  [place]. of [place] - variety of support groups  [place] Call for more information  Narcotics Anonymous (NA), [facility] [address], [employer] Susan Moore  2 meetings at this location   Special educational needs teacher         Address  Phone  Notes  [facility] Residential Treatment [place],    [place]  [phone]   [facility]  [address] [place] [address], [place], [place]   [place], [place] [phone] Admissions: 8am-3pm M-F  Incentives Substance [place] [address]    [place], [place] [phone]   [facility] [address] Jadene Pierini [place], [place]   [facility] [address],  [place], [place]   [facility] - Intensive Outpatient [address], [place], [place]   [facility] ([place].,  [place], [place] or [phone]   [facility] ([facility]S) [address], [place], [place] Accepts Medicaid  [facility] [place] [address],  [place] [phone] Substance Abuse/Addiction Treatment   [facility] Resources Organization  Address  Phone  Notes  °[facility]  [phone]   °Julie Brannon, PhD [address] [place], [place]   [phone] or [phone]   °[facility]   [address] °[place], [place] [phone]   °[facility] [address], [place], [place] [phone] Insurance/Medicaid/sponsorship through [facility]  °[facility] [address]                                    [place], [place] [phone] Therapy/tele-psych/case  °[facility] [address]  ° [place], [place] [phone]    °Dr. Arfeen  [phone]   °[facility] [place]  [employer] [place] [facility] 1) [address], [place] °2) [address], [place] °3)  [address], [place] [phone] °([phone] ° °([phone]) [phone]   °[place] Child Abuse Hotline [phone]) 342-1394 or ([phone] (After Hours)    ° ° ° °

## 2015-01-17 NOTE — ED Notes (Signed)
Pt reported rt knee and lt 4th finger  pain s/p to tripping and falling x 1 week ago. (+)PMS, CRT brisk, no deformity/injury/bruising/swelling and partial weight bearing noted.

## 2015-01-17 NOTE — ED Provider Notes (Signed)
CSN: [id number]     Arrival date & time 01/17/15  1650 History   First MD Initiated Contact with Patient 01/17/15 1726     No chief complaint on file.   HPI   Rada Hayorma Vasquez-Chavarria is a 39 y.o. female with no pertinent PMH who presents to the ED with right knee pain and left 4th finger pain after falling and landing on her knee and hand 1 week ago. She denies hitting her head or additional injury. She reports movement exacerbates her pain. She has not tried anything for symptom relief. She denies numbness, weakness, paresthesia.   Past Medical History  Diagnosis Date  . Plantar fasciitis   . GERD (gastroesophageal reflux disease)   . Chronic pelvic pain in female    History reviewed. No pertinent past surgical history. Family History  Problem Relation Age of Onset  . Cancer Mother     ovarian   . Depression Mother   . Diabetes Maternal Uncle   . Heart disease Maternal Uncle   . Heart disease Maternal Grandmother    Social History  Substance Use Topics  . Smoking status: Never Smoker   . Smokeless tobacco: Never Used  . Alcohol Use: Yes     Comment: occasional   OB History    No data available      Review of Systems  Musculoskeletal: Positive for arthralgias.  Skin: Negative for wound.  Neurological: Negative for weakness and numbness.      Allergies  Review of patient's allergies indicates no known allergies.  Home Medications   Prior to Admission medications   Medication Sig Start Date End Date Taking? Authorizing Provider  buPROPion (WELLBUTRIN XL) 150 MG 24 hr tablet Take 1 tablet (150 mg total) by mouth daily. Patient not taking: Reported on 04/08/2014 03/30/14   Dessa PhiJosalyn Funches, MD  diclofenac (VOLTAREN) 75 MG EC tablet Take 1 tablet (75 mg total) by mouth 2 (two) times daily as needed (with food). Patient not taking: Reported on 04/08/2014 03/27/14   Dessa PhiJosalyn Funches, MD  ibuprofen (ADVIL,MOTRIN) 800 MG tablet Take 1 tablet (800 mg total) by mouth 3 (three)  times daily. 01/17/15   Mady GemmaElizabeth C Westfall, PA-C  naproxen (NAPROSYN) 500 MG tablet Take 1 tablet (500 mg total) by mouth 2 (two) times daily with a meal. 04/08/14   Everlene FarrierWilliam Dansie, PA-C  nitrofurantoin, macrocrystal-monohydrate, (MACROBID) 100 MG capsule Take 1 capsule (100 mg total) by mouth 2 (two) times daily. 04/08/14   Everlene FarrierWilliam Dansie, PA-C    LMP 01/14/2015 Physical Exam  Constitutional: She is oriented to person, place, and time. She appears well-developed and well-nourished. No distress.  HENT:  Head: Normocephalic and atraumatic.  Right Ear: External ear normal.  Left Ear: External ear normal.  Nose: Nose normal.  Mouth/Throat: Oropharynx is clear and moist.  Eyes: Conjunctivae and EOM are normal. Pupils are equal, round, and reactive to light. Right eye exhibits no discharge. Left eye exhibits no discharge. No scleral icterus.  Neck: Normal range of motion. Neck supple.  Cardiovascular: Normal rate and regular rhythm.   Pulmonary/Chest: Effort normal and breath sounds normal. No respiratory distress.  Musculoskeletal: She exhibits tenderness. She exhibits no edema.       Right knee: She exhibits decreased range of motion and bony tenderness. She exhibits no swelling, no effusion, no deformity, no LCL laxity and no MCL laxity. Tenderness found. Medial joint line and lateral joint line tenderness noted.  Mild tenderness to palpation of left fourth MCP with decreased range  of motion due to pain. Strength and sensation intact. Cap refill less than 3 seconds. Diffuse tenderness to palpation of right knee with decreased range of motion due to pain. Negative anterior and posterior drawer testing. No significant edema, erythema, ecchymosis. Strength and sensation intact. Distal pulses intact.  Neurological: She is alert and oriented to person, place, and time.  Skin: Skin is warm and dry. She is not diaphoretic.  Psychiatric: She has a normal mood and affect. Her behavior is normal.  Nursing  note and vitals reviewed.   ED Course  Procedures (including critical care time)  Labs Review Labs Reviewed - No data to display  Imaging Review Dg Knee Complete 4 Views Right  01/17/2015  CLINICAL DATA:  Fall at home 1 week prior with right knee injury. Pain since that time. EXAM: RIGHT KNEE - COMPLETE 4+ VIEW COMPARISON:  Right knee radiographs 12/17/2013 FINDINGS: No fracture or dislocation. Mild tricompartmental spurring. Equivocal minimal medial tibial femoral joint space narrowing. Remaining joint spaces are preserved. There is a small-moderate joint effusion. IMPRESSION: Joint effusion with mild osteoarthritis. No fracture or dislocation. Electronically Signed   By: Rubye Oaks M.D.   On: 01/17/2015 18:45   Dg Hand Complete Left  01/17/2015  CLINICAL DATA:  Larey Seat 1 week ago at home and injured left hand. EXAM: LEFT HAND - COMPLETE 3+ VIEW COMPARISON:  None. FINDINGS: The joint spaces are maintained.  No acute fracture is identified. IMPRESSION: No acute bony findings. Electronically Signed   By: Rudie Meyer M.D.   On: 01/17/2015 18:43   I have personally reviewed and evaluated these images and lab results as part of my medical decision-making.   EKG Interpretation None      MDM   Final diagnoses:  Fall  Right knee pain  Left hand pain    39 year old female presents with right knee pain and left hand pain s/p fall 1 week ago. Denies numbness, weakness, paresthesia. Mild tenderness palpation of left fourth MCP with decreased range of motion due to pain. Strength and sensation intact. Cap refill less than 3 seconds. Diffuse tenderness to palpation of right knee with decreased range of motion due to pain. Negative anterior and posterior drawer testing. No significant edema, erythema, or ecchymosis. Strength and sensation intact. Distal pulses intact.   Will obtain imaging of right knee and left hand. Patient given ibuprofen and ice for pain. Imaging of right knee remarkable  for effusion, no fracture or dislocation. Imaging of left hand negative for bony findings. Knee sleeve applied. Will treat with ibuprofen and RICE therapy. Return precautions discussed. Patient to follow-up with PCP. Patient verbalizes her understanding and is in agreement with plan.     Mady Gemma, PA-C 01/17/15 2012  Courteney Randall An, MD 01/17/15 2215

## 2015-03-30 ENCOUNTER — Ambulatory Visit: Payer: Medicaid Other | Attending: Family Medicine | Admitting: Family Medicine

## 2015-03-30 ENCOUNTER — Encounter: Payer: Self-pay | Admitting: Family Medicine

## 2015-03-30 VITALS — BP 130/90 | HR 73 | Temp 98.2°F | Resp 13 | Ht 61.0 in | Wt 282.8 lb

## 2015-03-30 DIAGNOSIS — Z800 Family history of malignant neoplasm of digestive organs: Secondary | ICD-10-CM | POA: Diagnosis not present

## 2015-03-30 DIAGNOSIS — N63 Unspecified lump in unspecified breast: Secondary | ICD-10-CM

## 2015-03-30 DIAGNOSIS — Z6841 Body Mass Index (BMI) 40.0 and over, adult: Secondary | ICD-10-CM | POA: Insufficient documentation

## 2015-03-30 DIAGNOSIS — Z23 Encounter for immunization: Secondary | ICD-10-CM | POA: Insufficient documentation

## 2015-03-30 DIAGNOSIS — N644 Mastodynia: Secondary | ICD-10-CM | POA: Diagnosis not present

## 2015-03-30 DIAGNOSIS — Z131 Encounter for screening for diabetes mellitus: Secondary | ICD-10-CM | POA: Diagnosis not present

## 2015-03-30 DIAGNOSIS — Z0000 Encounter for general adult medical examination without abnormal findings: Secondary | ICD-10-CM

## 2015-03-30 DIAGNOSIS — G4700 Insomnia, unspecified: Secondary | ICD-10-CM | POA: Insufficient documentation

## 2015-03-30 DIAGNOSIS — Z634 Disappearance and death of family member: Secondary | ICD-10-CM | POA: Diagnosis not present

## 2015-03-30 LAB — POCT GLYCOSYLATED HEMOGLOBIN (HGB A1C): Hemoglobin A1C: 4.8

## 2015-03-30 MED ORDER — ALPRAZOLAM 0.25 MG PO TABS: 0.2500 mg | ORAL_TABLET | Freq: Every evening | ORAL | Status: DC | PRN

## 2015-03-30 NOTE — Progress Notes (Signed)
Subjective:  Patient ID: Madison Flowers, female    DOB: May 29, 1975  Age: 40 y.o. MRN: [id number]  CC: Breast Mass   HPI Roshana Shuffield is a 40 year old morbidly obese female who comes into the clinic complaining of right breast lump, right axillary lump, pain in left breast pain and numbness for the last few months which wax and wane with her periods.  She lost her mom 3 months ago to gastric cancer and her aunt last month and due to that has not been able to seek medical attention for her breast symptoms. She complains of insomnia leading to her going to bed in the early hours of the morning around 4 AM this has caused her to eat so much leading to significant weight gain which she is not happy with. She is wondering if she can be placed on a diet suppressant like Phentermine.  Outpatient Prescriptions Prior to Visit  Medication Sig Dispense Refill  . buPROPion (WELLBUTRIN XL) 150 MG 24 hr tablet Take 1 tablet (150 mg total) by mouth daily. (Patient not taking: Reported on 04/08/2014) 30 tablet 1  . diclofenac (VOLTAREN) 75 MG EC tablet Take 1 tablet (75 mg total) by mouth 2 (two) times daily as needed (with food). (Patient not taking: Reported on 04/08/2014) 60 tablet 0  . ibuprofen (ADVIL,MOTRIN) 800 MG tablet Take 1 tablet (800 mg total) by mouth 3 (three) times daily. (Patient not taking: Reported on 03/30/2015) 21 tablet 0  . naproxen (NAPROSYN) 500 MG tablet Take 1 tablet (500 mg total) by mouth 2 (two) times daily with a meal. (Patient not taking: Reported on 03/30/2015) 30 tablet 0  . nitrofurantoin, macrocrystal-monohydrate, (MACROBID) 100 MG capsule Take 1 capsule (100 mg total) by mouth 2 (two) times daily. (Patient not taking: Reported on 03/30/2015) 10 capsule 0   No facility-administered medications prior to visit.    ROS Review of Systems  Constitutional: Negative for activity change and appetite change.  HENT: Negative for sinus pressure and sore throat.     Respiratory: Negative for chest tightness, shortness of breath and wheezing.   Cardiovascular: Negative for chest pain and palpitations.  Gastrointestinal: Negative for abdominal pain, constipation and abdominal distention.  Genitourinary: Negative.   Musculoskeletal: Negative.   Psychiatric/Behavioral: Negative for behavioral problems and dysphoric mood.    Objective:  BP 130/90 mmHg  Pulse 73  Temp(Src) 98.2 F (36.8 C)  Resp 13  Ht  (1.549 m)  Wt 282 lb 12.8 oz (128.277 kg)  BMI 53.46 kg/m2  SpO2 98%  LMP 02/25/2015  BP/Weight 03/30/2015 04/08/2014 03/27/2014  Systolic BP 130 119 139  Diastolic BP 90 78 87  Wt. (Lbs) 282.8 - 279  BMI 53.46 - 52.74      Physical Exam  Constitutional: She is oriented to person, place, and time. She appears well-developed and well-nourished.  Cardiovascular: Normal rate, normal heart sounds and intact distal pulses.   No murmur heard. Pulmonary/Chest: Effort normal and breath sounds normal. She has no wheezes. She has no rales. She exhibits no tenderness.  Abdominal: Soft. Bowel sounds are normal. She exhibits no distension and no mass. There is no tenderness.  Genitourinary: There is breast tenderness (tendernessin right breast at 10:00, no palpable lump. Tenderness in the left breast at 3:00, no palpable lump. No palpable lumps in both axillary regions and no evidence of lymphadenopathy).  Breasts are dense bilaterally.  Musculoskeletal: Normal range of motion.  Neurological: She is alert and oriented to person, place, and time.  Assessment & Plan:   1. Diabetes mellitus screening A1c is 4.8-normal - HgB A1c  2. Healthcare maintenance - Flu Vaccine QUAD 36+ mos PF IM (Fluarix & Fluzone Quad PF)  3. Bereavement She will need to be seen by the LCSW to arrange brief counseling sessions and discussed available community resources. She will be to develop coping skills to avoid excessive eating and weight gain. - ALPRAZolam  (XANAX) 0.25 MG tablet; Take 1 tablet (0.25 mg total) by mouth at bedtime as needed for anxiety. 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 Breast lump in female I do not palpate any lump on my exam however I am able to elicit pain in both breasts and have referred her for breast imaging which will be reviewed at her next visit. It is also possible she might have a fibroadenoma as symptoms wax and wane with her periods. - MM Digital Diagnostic Bilat; Future - US BREAST COMPLETE UNI LEFT INC AXILLA; Future - US BREAST COMPLETE UNI RIGHT INC AXILLA; Future   Meds ordered this encounter  Medications  . ALPRAZolam (XANAX) 0.25 MG tablet    Sig: Take 1 tablet (0.25 mg total) by mouth at bedtime as needed for anxiety. 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   Jaclyn Shaggy MD

## 2015-03-30 NOTE — Progress Notes (Signed)
Patient has not been seen by PCP in 1 year She reports right and left breast lumps painful upon palpation She also reports feeling the lumps under each arm Her mother has a history of ovarian cancer She will take a flu shot today

## 2015-04-01 ENCOUNTER — Telehealth: Payer: Self-pay | Admitting: Family Medicine

## 2015-04-01 NOTE — Telephone Encounter (Signed)
Felia from [facility] called stating that she needed to be faxed an order or sign the order in Epic for the patient. To be seen Please follow up    [phone] Ext. [phone] [facility]

## 2015-04-02 ENCOUNTER — Ambulatory Visit
Admission: RE | Admit: 2015-04-02 | Discharge: 2015-04-02 | Disposition: A | Discharge status: Home / Self Care | Payer: Medicaid Other | Source: Ambulatory Visit | Attending: Family Medicine | Admitting: Family Medicine

## 2015-04-02 ENCOUNTER — Other Ambulatory Visit: Payer: Self-pay | Admitting: Family Medicine

## 2015-04-02 DIAGNOSIS — N63 Unspecified lump in unspecified breast: Secondary | ICD-10-CM

## 2015-04-02 NOTE — Telephone Encounter (Signed)
I signed the order yesterday and  It should have been faxed yesterday.

## 2015-04-02 NOTE — Telephone Encounter (Signed)
Requested routed to Dr. Venetia Night who saw pateint on 03/30/15 for breast lump

## 2015-04-05 ENCOUNTER — Telehealth: Payer: Self-pay | Admitting: *Deleted

## 2015-04-05 NOTE — Telephone Encounter (Signed)
Document faxed on January 18th.  This RN has the fax confirmation sheet

## 2015-04-05 NOTE — Telephone Encounter (Signed)
PI id# [id number]  Interpreter spoke to patient and patient hung up.

## 2015-04-05 NOTE — Telephone Encounter (Signed)
-----   Message from Jaclyn Shaggy, MD sent at 04/05/2015  9:06 AM EST ----- Mammogram and ultrasounds were negative for breast malignancy but revealed a benign cyst.

## 2015-04-20 ENCOUNTER — Ambulatory Visit: Payer: Medicaid Other | Admitting: Family Medicine

## 2015-04-20 ENCOUNTER — Ambulatory Visit: Payer: Medicaid Other | Admitting: Clinical

## 2015-05-07 ENCOUNTER — Encounter: Payer: Self-pay | Admitting: Clinical

## 2015-05-07 NOTE — Progress Notes (Signed)
Depression screen [facility] 2/9 03/30/2015 03/27/2014 12/12/2013  Decreased Interest 2 0 0  Down, Depressed, Hopeless 2 0 0  PHQ - 2 Score 4 0 0  Altered sleeping 1 - -  Tired, decreased energy 2 - -  Change in appetite 3 - -  Feeling bad or failure about yourself  1 - -  Trouble concentrating 1 - -  Moving slowly or fidgety/restless 0 - -  Suicidal thoughts 1 - -  PHQ-9 Score 13 - -    GAD 7 : Generalized Anxiety Score 03/30/2015  Nervous, Anxious, on Edge 2  Control/stop worrying 1  Worry too much - different things 1  Trouble relaxing 1  Restless 1  Easily annoyed or irritable 2  Afraid - awful might happen 2  Total GAD 7 Score 10

## 2015-06-08 ENCOUNTER — Encounter (HOSPITAL_COMMUNITY): Payer: Self-pay

## 2015-06-08 ENCOUNTER — Emergency Department (HOSPITAL_COMMUNITY): Payer: Medicaid Other

## 2015-06-08 ENCOUNTER — Emergency Department (HOSPITAL_COMMUNITY)
Admission: EM | Admit: 2015-06-08 | Discharge: 2015-06-08 | Disposition: A | Discharge status: Home / Self Care | Payer: Medicaid Other | Attending: Emergency Medicine | Admitting: Emergency Medicine

## 2015-06-08 DIAGNOSIS — Z8739 Personal history of other diseases of the musculoskeletal system and connective tissue: Secondary | ICD-10-CM | POA: Diagnosis not present

## 2015-06-08 DIAGNOSIS — N644 Mastodynia: Secondary | ICD-10-CM | POA: Diagnosis present

## 2015-06-08 DIAGNOSIS — N611 Abscess of the breast and nipple: Secondary | ICD-10-CM

## 2015-06-08 DIAGNOSIS — G8929 Other chronic pain: Secondary | ICD-10-CM | POA: Insufficient documentation

## 2015-06-08 DIAGNOSIS — Z8719 Personal history of other diseases of the digestive system: Secondary | ICD-10-CM | POA: Diagnosis not present

## 2015-06-08 DIAGNOSIS — L0390 Cellulitis, unspecified: Secondary | ICD-10-CM

## 2015-06-08 MED ORDER — SULFAMETHOXAZOLE-TRIMETHOPRIM 800-160 MG PO TABS: 1.0000 | ORAL_TABLET | Freq: Two times a day (BID) | ORAL | Status: AC

## 2015-06-08 MED ORDER — CEPHALEXIN 500 MG PO CAPS: 500.0000 mg | ORAL_CAPSULE | Freq: Four times a day (QID) | ORAL | Status: DC

## 2015-06-08 MED ORDER — HYDROCODONE-ACETAMINOPHEN 5-325 MG PO TABS
1.0000 | ORAL_TABLET | Freq: Once | ORAL | Status: AC
  Administered 2015-06-08: 1 via ORAL
  Filled 2015-06-08: qty 1

## 2015-06-08 MED ORDER — IBUPROFEN 800 MG PO TABS
800.0000 mg | ORAL_TABLET | Freq: Once | ORAL | Status: AC
  Administered 2015-06-08: 800 mg via ORAL
  Filled 2015-06-08: qty 1

## 2015-06-08 MED ORDER — HYDROCODONE-ACETAMINOPHEN 5-325 MG PO TABS: 1.0000 | ORAL_TABLET | ORAL | Status: DC | PRN

## 2015-06-08 NOTE — ED Provider Notes (Signed)
CSN: [id number]     Arrival date & time 06/08/15  1703 History  By signing my name below, I, Tanda Rockers, attest that this documentation has been prepared under the direction and in the presence of Cheri Fowler, PA-C. Electronically Signed: Tanda Rockers, ED Scribe. 06/08/2015. 7:08 PM.   Chief Complaint  Patient presents with  . Cellulitis   The history is provided by the patient. The history is limited by a language barrier. No language interpreter was used.     HPI Comments: Madison Flowers is a 40 y.o. female who presents to the Emergency Department complaining of gradual onset, constant, redness, swelling, and pain to left breast x 2-3 days. Pt reports that it started out as a pimple but has increased in size. She attempted to cut the area and notes blood coming out, but denies purulent drainage. Pt also complains of subjective fever. She had similar symptoms on her right breast in the past that popped on its own. She is not currently breastfeeding. Pt had mammogram a couple of months ago with no acute findings. Denies chills, nausea, vomiting, or any other associated symptoms.   Past Medical History  Diagnosis Date  . Plantar fasciitis   . GERD (gastroesophageal reflux disease)   . Chronic pelvic pain in female    History reviewed. No pertinent past surgical history. Family History  Problem Relation Age of Onset  . Cancer Mother     ovarian   . Depression Mother   . Diabetes Maternal Uncle   . Heart disease Maternal Uncle   . Heart disease Maternal Grandmother    Social History  Substance Use Topics  . Smoking status: Never Smoker   . Smokeless tobacco: Never Used  . Alcohol Use: Yes     Comment: occasional   OB History    No data available     Review of Systems  Constitutional: Positive for fever (subjective). Negative for chills.  Gastrointestinal: Negative for nausea and vomiting.  Skin:       + Redness, swelling, and pain to left breast Negative for  discharge  All other systems reviewed and are negative.     Allergies  Review of patient's allergies indicates no known allergies.  Home Medications   Prior to Admission medications   Medication Sig Start Date End Date Taking? Authorizing Provider  ALPRAZolam (XANAX) 0.25 MG tablet Take 1 tablet (0.25 mg total) by mouth at bedtime as needed for anxiety. 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 Patient not taking: Reported on 04/08/2014 03/30/14   Dessa Phi, MD  diclofenac (VOLTAREN) 75 MG EC tablet Take 1 tablet (75 mg total) by mouth 2 (two) times daily as needed (with food). Patient not taking: Reported on 04/08/2014 03/27/14   Dessa Phi, MD  ibuprofen (ADVIL,MOTRIN) 800 MG tablet Take 1 tablet (800 mg total) by mouth 3 (three) times daily. Patient not taking: Reported on 03/30/2015 01/17/15   Mady Gemma, PA-C  naproxen (NAPROSYN) 500 MG tablet Take 1 tablet (500 mg total) by mouth 2 (two) times daily with a meal. Patient not taking: Reported on 03/30/2015 04/08/14   Everlene Farrier, PA-C  nitrofurantoin, macrocrystal-monohydrate, (MACROBID) 100 MG capsule Take 1 capsule (100 mg total) by mouth 2 (two) times daily. Patient not taking: Reported on 03/30/2015 04/08/14   Everlene Farrier, PA-C   BP 128/65 mmHg  Pulse 89  Temp(Src) 98.1 F (36.7 C) (Oral)  Resp 18  SpO2 98%  LMP 05/31/2015   Physical Exam  Constitutional: She is oriented to person, place, and time. She appears well-developed and well-nourished.  Non-toxic appearance. She does not have a sickly appearance. She does not appear ill.  HENT:  Head: Normocephalic and atraumatic.  Mouth/Throat: Oropharynx is clear and moist.  Eyes: Conjunctivae are normal. Pupils are equal, round, and reactive to light.  Neck: Normal range of motion. Neck supple.  Cardiovascular: Normal rate, regular rhythm and normal  heart sounds.   No murmur heard. Pulmonary/Chest: Effort normal and breath sounds normal. No accessory muscle usage or stridor. No respiratory distress. She has no wheezes. She has no rhonchi. She has no rales.  Abdominal: Soft. Bowel sounds are normal. She exhibits no distension. There is no tenderness.  Musculoskeletal: Normal range of motion.  Lymphadenopathy:    She has no cervical adenopathy.  Neurological: She is alert and oriented to person, place, and time.  Speech clear without dysarthria.  Skin: Skin is warm and dry.  Left breast; area of erythema, warmth, induration in the first quadrant. No fluctuance appreciated. No active drainage.   Psychiatric: She has a normal mood and affect. Her behavior is normal.    ED Course  Procedures (including critical care time)  DIAGNOSTIC STUDIES: Oxygen Saturation is 98% on RA, normal by my interpretation.    COORDINATION OF CARE: 7:05 PM-Discussed treatment plan which includes ultrasound with pt at bedside and pt agreed to plan.   Labs Review Labs Reviewed - No data to display  Imaging Review No results found. I have personally reviewed and evaluated these images as part of my medical decision-making.   EKG Interpretation None      MDM   Final diagnoses:  None   Patient presents with cellulitis of left breast.  Subjective fever.  No N/V, abdominal pain.  VSS, NAD.  On exam, area of erythema, warmth, and induration in quadrant I.  No active drainage.  No fluctuance appreciated on exam.  Patient given ibuprofen in ED.  Plan to obtain U/S to evaluate for underlying abscess.  Patient care hand off to oncoming provider, Elpidio AnisShari Upstill, PA-C at shift change who will follow up on breast ultrasound and appropriate disposition.  Anticipate discharge home with keflex, bactrim, and norco.  I personally performed the services described in this documentation, which was scribed in my presence. The recorded information has been reviewed and  is accurate.       Cheri FowlerKayla Hurley Sobel, PA-C 06/08/15 2007  Samuel JesterKathleen McManus, DO 06/11/15 [phone]

## 2015-06-08 NOTE — ED Provider Notes (Signed)
Patient signed out at end of shift from [facility]la Rose, [place]PA-C, pending breast US to evaluate for abscess vs cellulitis in left breast.   She has had intermittent, recurrent painful lumps in breast bilaterally as well as axillary areas, also bilaterally, for several months. Seen by PCP in January, had mammogram showing right axillary sebaceous cyst but no breast abnormalities. Tonight presents with left breast redness and tenderness but no draining lesion or fever. Per exam of previous provider, left breast has 3 cm area of redness and induration without fluctuance or drainage. Suspect cellulitis with plan to d/ch home with abx, pain management, PCP follow up.  Will wait for US results.   8:20 - introduced myself to patient. US has not been done yet. Ibuprofen without significant relief. Will provide Norco.  10:00 - Pain is better with Norco. US shows a 2 x 3 cm superficial abscess left breast. Discussed with Dr. Johna SheriffHoxworth (gen surgery) who advised he would follow in the office. He further recommended that the patient be referred, if possible, to [facility] for aspiration of the abscess prior to his office follow up. Both referrals provided. The patient has been placed on Keflex and Septra DS, with Norco for pain.   Elpidio AnisShari Sherrie Marsan, PA-C 06/08/15 2212  Samuel JesterKathleen McManus, DO 06/11/15 [phone]

## 2015-06-08 NOTE — ED Notes (Signed)
Pt with cellulitis noted to left breast. Warm and painful.  Pt trying to fix it at home with sharp object.  Pain is spreading

## 2015-06-08 NOTE — Discharge Instructions (Signed)
Heat Therapy Heat therapy can help ease sore, stiff, injured, and tight muscles and joints. Heat relaxes your muscles, which may help ease your pain.  RISKS AND COMPLICATIONS If you have any of the following conditions, do not use heat therapy unless your health care provider has approved:  Poor circulation.  Healing wounds or scarred skin in the area being treated.  Diabetes, heart disease, or high blood pressure.  Not being able to feel (numbness) the area being treated.  Unusual swelling of the area being treated.  Active infections.  Blood clots.  Cancer.  Inability to communicate pain. This may include young children and people who have problems with their brain function (dementia).  Pregnancy. Heat therapy should only be used on old, pre-existing, or long-lasting (chronic) injuries. Do not use heat therapy on new injuries unless directed by your health care provider. HOW TO USE HEAT THERAPY There are several different kinds of heat therapy, including:  Moist heat pack.  Warm water bath.  Hot water bottle.  Electric heating pad.  Heated gel pack.  Heated wrap.  Electric heating pad. Use the heat therapy method suggested by your health care provider. Follow your health care provider's instructions on when and how to use heat therapy. GENERAL HEAT THERAPY RECOMMENDATIONS  Do not sleep while using heat therapy. Only use heat therapy while you are awake.  Your skin may turn pink while using heat therapy. Do not use heat therapy if your skin turns red.  Do not use heat therapy if you have new pain.  High heat or long exposure to heat can cause burns. Be careful when using heat therapy to avoid burning your skin.  Do not use heat therapy on areas of your skin that are already irritated, such as with a rash or sunburn. SEEK MEDICAL CARE IF:  You have blisters, redness, swelling, or numbness.  You have new pain.  Your pain is worse. MAKE SURE  YOU:  Understand these instructions.  Will watch your condition.  Will get help right away if you are not doing well or get worse.   This information is not intended to replace advice given to you by your health care provider. Make sure you discuss any questions you have with your health care provider.   Document Released: 05/22/2011 Document Revised: 03/20/2014 Document Reviewed: 04/22/2013 Elsevier Interactive Patient Education 2016 [employer] Abscess An abscess is an infected area that contains a collection of pus and debris.It can occur in almost any part of the body. An abscess is also known as a furuncle or boil. CAUSES  An abscess occurs when tissue gets infected. This can occur from blockage of oil or sweat glands, infection of hair follicles, or a minor injury to the skin. As the body tries to fight the infection, pus collects in the area and creates pressure under the skin. This pressure causes pain. People with weakened immune systems have difficulty fighting infections and get certain abscesses more often.  SYMPTOMS Usually an abscess develops on the skin and becomes a painful mass that is red, warm, and tender. If the abscess forms under the skin, you may feel a moveable soft area under the skin. Some abscesses break open (rupture) on their own, but most will continue to get worse without care. The infection can spread deeper into the body and eventually into the bloodstream, causing you to feel ill.  DIAGNOSIS  Your caregiver will take your medical history and perform a physical exam. A sample of  fluid may also be taken from the abscess to determine what is causing your infection. TREATMENT  Your caregiver may prescribe antibiotic medicines to fight the infection. However, taking antibiotics alone usually does not cure an abscess. Your caregiver may need to make a small cut (incision) in the abscess to drain the pus. In some cases, gauze is packed into the abscess to reduce  pain and to continue draining the area. HOME CARE INSTRUCTIONS   Only take over-the-counter or prescription medicines for pain, discomfort, or fever as directed by your caregiver.  If you were prescribed antibiotics, take them as directed. Finish them even if you start to feel better.  If gauze is used, follow your caregiver's directions for changing the gauze.  To avoid spreading the infection:  Keep your draining abscess covered with a bandage.  Wash your hands well.  Do not share personal care items, towels, or whirlpools with others.  Avoid skin contact with others.  Keep your skin and clothes clean around the abscess.  Keep all follow-up appointments as directed by your caregiver. SEEK MEDICAL CARE IF:   You have increased pain, swelling, redness, fluid drainage, or bleeding.  You have muscle aches, chills, or a general ill feeling.  You have a fever. MAKE SURE YOU:   Understand these instructions.  Will watch your condition.  Will get help right away if you are not doing well or get worse.   This information is not intended to replace advice given to you by your health care provider. Make sure you discuss any questions you have with your health care provider.   Document Released: 12/07/2004 Document Revised: 08/29/2011 Document Reviewed: 05/12/2011 Elsevier Interactive Patient Education [employer]

## 2015-06-26 IMAGING — CR DG KNEE COMPLETE 4+V*L*
4 series · 4 of 4 positions shown · non-contrast
Comparison: None.

CLINICAL DATA: Proximal lower leg pain.

EXAM:
LEFT KNEE - COMPLETE 4+ VIEW

[t knee ap left]
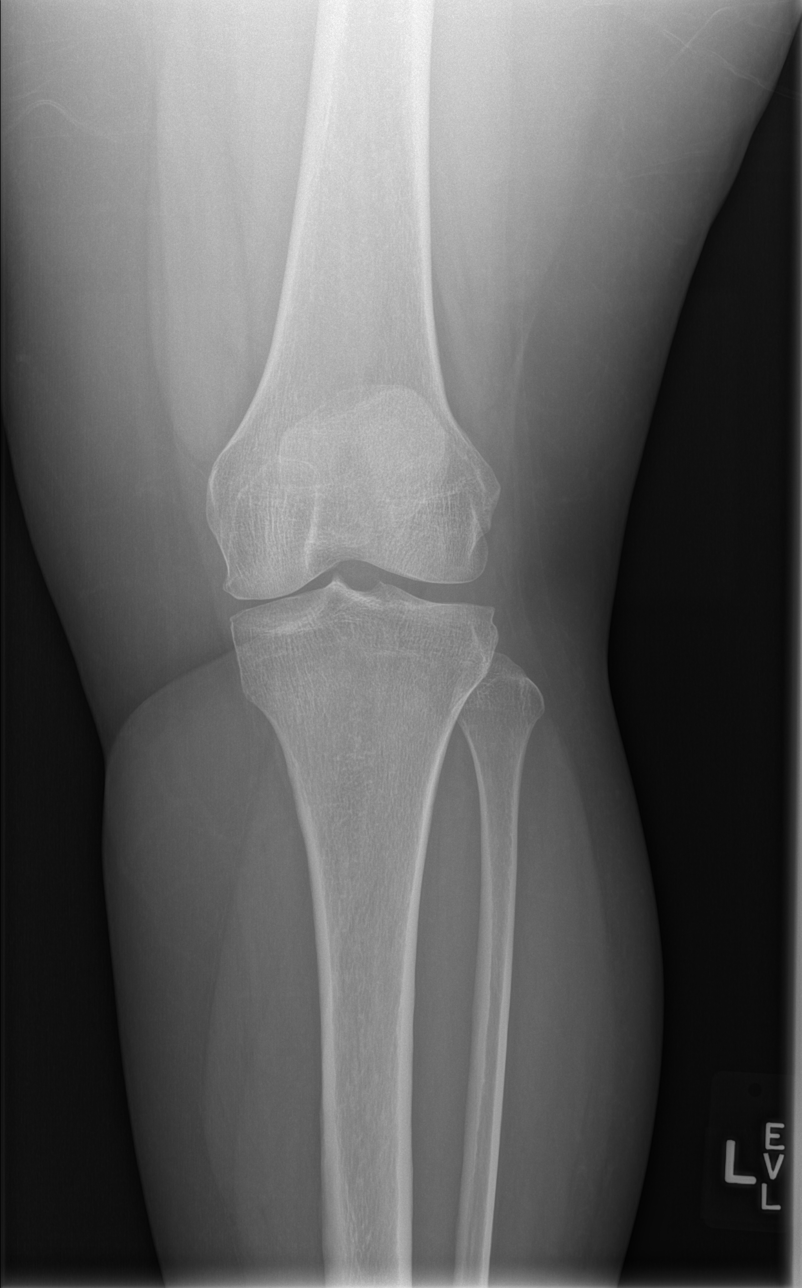

[t knee obl left (1 of 2)]
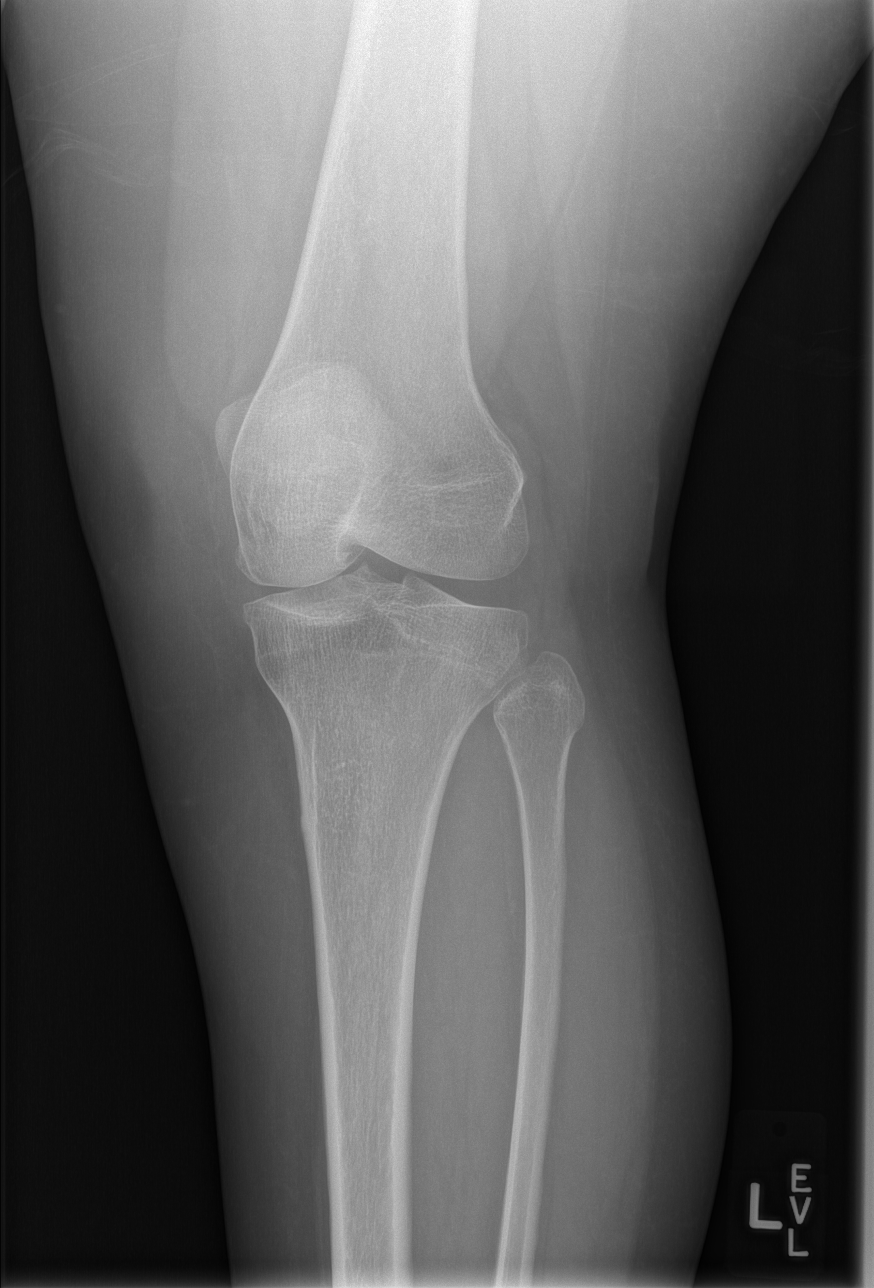

[t knee obl left (2 of 2)]
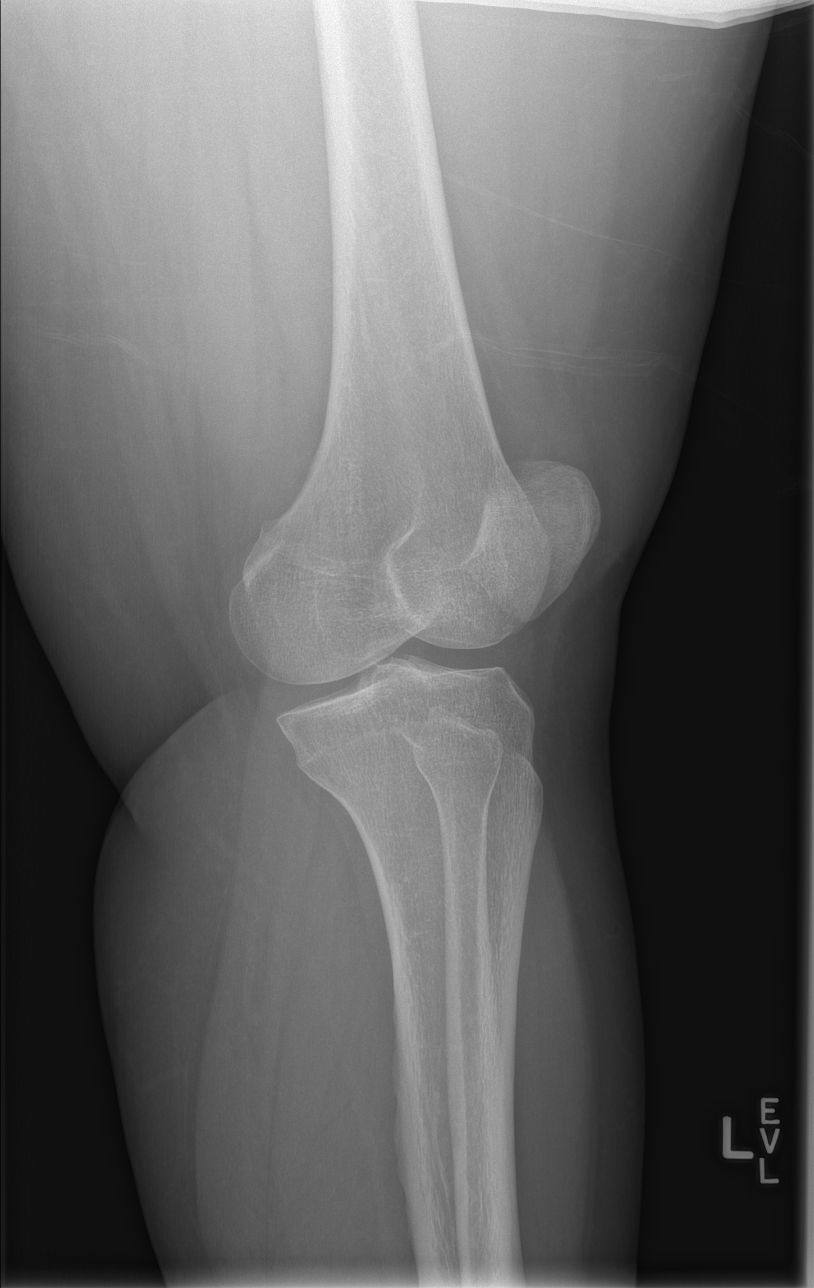

[t knee lat left]
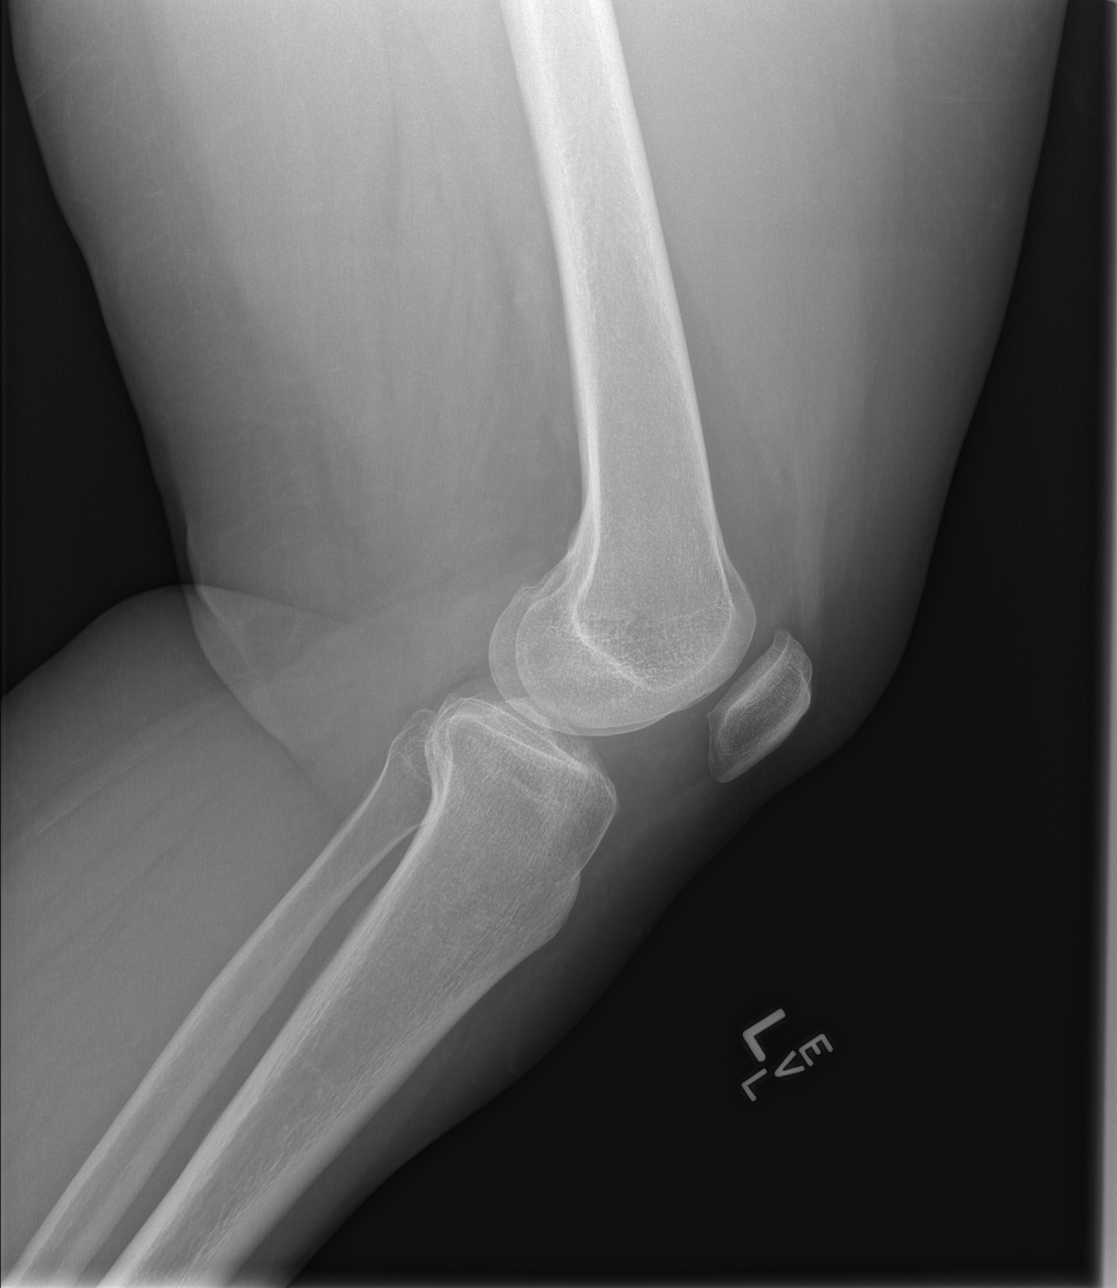

[4 of 4 positions shown; findings below may reference images not displayed]

FINDINGS: Tiny marginal osteophytes on the patella. Joint effusion. Minimal
medial joint space narrowing.
IMPRESSION: Minimal degenerative changes of the left knee.  Moderate effusion.

## 2015-06-29 ENCOUNTER — Ambulatory Visit: Payer: Medicaid Other | Admitting: Internal Medicine

## 2015-07-07 ENCOUNTER — Ambulatory Visit: Payer: Medicaid Other

## 2015-11-01 ENCOUNTER — Emergency Department (HOSPITAL_COMMUNITY): Payer: Medicaid Other

## 2015-11-01 ENCOUNTER — Encounter (HOSPITAL_COMMUNITY): Payer: Self-pay

## 2015-11-01 ENCOUNTER — Emergency Department (HOSPITAL_COMMUNITY)
Admission: EM | Admit: 2015-11-01 | Discharge: 2015-11-01 | Disposition: A | Discharge status: Home / Self Care | Payer: Medicaid Other | Attending: Emergency Medicine | Admitting: Emergency Medicine

## 2015-11-01 DIAGNOSIS — M25561 Pain in right knee: Secondary | ICD-10-CM | POA: Diagnosis present

## 2015-11-01 MED ORDER — NAPROXEN 500 MG PO TABS: 500.0000 mg | ORAL_TABLET | Freq: Two times a day (BID) | ORAL | 0 refills | Status: DC

## 2015-11-01 NOTE — ED Notes (Signed)
Declined W/C at D/C and was escorted to lobby by RN. 

## 2015-11-01 NOTE — ED Triage Notes (Signed)
Pt complaining of R knee pain. Pt states injury ~ 6months ago. Pt states increasing knee pain yesterday. Pt denies any new falls/trauma today.

## 2015-11-01 NOTE — ED Provider Notes (Signed)
[facility]-EMERGENCY DEPT Provider Note   CSN: [id number] Arrival date & time: 11/01/15  1626  By signing my name below, I, Phillis Haggis, attest that this documentation has been prepared under the direction and in the presence of Felicie Morn, NP-C. Electronically Signed: Phillis Haggis, ED Scribe. 11/01/15. 6:43 PM.  History   Chief Complaint Chief Complaint  Patient presents with  . Knee Pain   The history is provided by the patient. No language interpreter was used.  Knee Pain   This is a recurrent problem. The current episode started more than 1 week ago. The problem occurs constantly. The problem has been gradually worsening. The pain is present in the right knee. The quality of the pain is described as aching. The pain is mild. Pertinent negatives include no numbness and no stiffness. The symptoms are aggravated by standing. She has tried nothing for the symptoms. There has been a history of trauma.  HPI Comments: Madison Flowers is a 40 y.o. female who presents to the Emergency Department complaining of gradually worsening, aching, pulling right knee pain that radiates to the right hip onset 6 months ago, worsening yesterday. Pt reports initial injury was 6 months ago when she fell and her right leg bent behind her. Pt reports worsening pain with ambulation and standing. She has not tried anything for her symptoms. She denies new trauma, falls, joint swelling, wound, numbness, or weakness.   Past Medical History:  Diagnosis Date  . Chronic pelvic pain in female   . GERD (gastroesophageal reflux disease)   . Plantar fasciitis     Patient Active Problem List   Diagnosis Date Noted  . Plantar fasciitis of left foot 03/27/2014  . Decreased libido 03/27/2014  . Dyspareunia 12/12/2013  . Patellar tendon strain 12/12/2013  . Hemorrhoids 12/12/2013    History reviewed. No pertinent surgical history.  OB History    No data available       Home Medications    Prior to  Admission medications   Medication Sig Start Date End Date Taking? Authorizing Provider  ALPRAZolam (XANAX) 0.25 MG tablet Take 1 tablet (0.25 mg total) by mouth at bedtime as needed for anxiety. 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 Patient not taking: Reported on 04/08/2014 03/30/14   Dessa Phi, MD  cephALEXin (KEFLEX) 500 MG capsule Take 1 capsule (500 mg total) by mouth 4 (four) times daily. 06/08/15   Elpidio Anis, PA-C  diclofenac (VOLTAREN) 75 MG EC tablet Take 1 tablet (75 mg total) by mouth 2 (two) times daily as needed (with food). Patient not taking: Reported on 04/08/2014 03/27/14   Dessa Phi, MD  HYDROcodone-acetaminophen (NORCO/VICODIN) 5-325 MG tablet Take 1-2 tablets by mouth every 4 (four) hours as needed. 06/08/15   Elpidio Anis, PA-C  ibuprofen (ADVIL,MOTRIN) 800 MG tablet Take 1 tablet (800 mg total) by mouth 3 (three) times daily. Patient not taking: Reported on 03/30/2015 01/17/15   Mady Gemma, PA-C  naproxen (NAPROSYN) 500 MG tablet Take 1 tablet (500 mg total) by mouth 2 (two) times daily with a meal. Patient not taking: Reported on 03/30/2015 04/08/14   Everlene Farrier, PA-C  nitrofurantoin, macrocrystal-monohydrate, (MACROBID) 100 MG capsule Take 1 capsule (100 mg total) by mouth 2 (two) times daily. Patient not taking: Reported on 03/30/2015 04/08/14   Everlene Farrier, PA-C    Family History Family History  Problem  Relation Age of Onset  . Cancer Mother     ovarian   . Depression Mother   . Diabetes Maternal Uncle   . Heart disease Maternal Uncle   . Heart disease Maternal Grandmother     Social History Social History  Substance Use Topics  . Smoking status: Never Smoker  . Smokeless tobacco: Never Used  . Alcohol use Yes     Comment: occasional     Allergies   Review of patient's allergies indicates no known  allergies.   Review of Systems Review of Systems  Musculoskeletal: Positive for arthralgias. Negative for joint swelling and stiffness.  Skin: Negative for wound.  Neurological: Negative for weakness and numbness.  All other systems reviewed and are negative.  Physical Exam Updated Vital Signs BP 130/63 (BP Location: Left Arm)   Pulse 78   Temp 98.3 F (36.8 C) (Oral)   Resp 16   LMP 10/18/2015   SpO2 98%   Physical Exam  Constitutional: She is oriented to person, place, and time. She appears well-developed and well-nourished.  HENT:  Head: Normocephalic and atraumatic.  Eyes: Conjunctivae and EOM are normal. Pupils are equal, round, and reactive to light.  Neck: Normal range of motion. Neck supple.  Musculoskeletal: Normal range of motion.  Right knee: Pain at the base of the patella, no joint instability noted; strong DP pulse  Neurological: She is alert and oriented to person, place, and time.  Skin: Skin is warm and dry.  Psychiatric: She has a normal mood and affect. Her behavior is normal.  Nursing note and vitals reviewed.  ED Treatments / Results  DIAGNOSTIC STUDIES: Oxygen Saturation is 98% on E by my interpretation.    COORDINATION OF CARE: 6:40 PM-Discussed treatment plan which includes x-ray, knee sleeve, and anti-inflammatory with pt at bedside and pt agreed to plan.    Labs (all labs ordered are listed, but only abnormal results are displayed) Labs Reviewed - No data to display  EKG  EKG Interpretation None       Radiology Dg Knee Complete 4 Views Right  Result Date: 11/01/2015 CLINICAL DATA:  Generalize right knee pain. History of a fall 6 months ago; increased symptoms began yesterday without recent re-injury. EXAM: RIGHT KNEE - COMPLETE 4+ VIEW COMPARISON:  Right knee series dated January 17, 2015 FINDINGS: The bones are subjectively adequately mineralized. There is mild narrowing of the medial joint compartment. There is beaking of the tibial  spines. A small spur arises from the peripheral articular margin of the lateral tibial plateau. Small spurs arise from the articular margins of the patella. There is no joint effusion. There is no acute fracture. 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 Mild joint space loss medially is more conspicuous than on the study of November 2016. No acute bony abnormality. Electronically Signed   By: Iyahna Obriant  [place] M.D.   On: 11/01/2015 17:08    Procedures Procedures (including critical care time)  Medications Ordered in ED Medications - No data to display   Initial Impression / Assessment and Plan / ED Course  I have reviewed the triage vital signs and the nursing notes.  Pertinent labs & imaging results that were available during my care of the patient were reviewed by me and considered in my medical decision making (see chart for details).  Clinical Course    Patient X-Ray negative for obvious fracture or dislocation. Pain managed in ED. Pt advised to follow up with orthopedics if symptoms persist.  Patient given knee sleeve while in ED, conservative therapy recommended and discussed. Patient will be dc home & is agreeable with above plan.  Final Clinical Impressions(s) / ED Diagnoses   Left knee pain.  Final diagnoses:  None  I personally performed the services described in this documentation, which was scribed in my presence. The recorded information has been reviewed and is accurate.    New Prescriptions New Prescriptions   No medications on file     Felicie MornDavid Ferdinando Lodge, NP 11/01/15 2223    Nelva Nayobert Beaton, MD 11/03/15 1356

## 2016-01-06 ENCOUNTER — Emergency Department (HOSPITAL_COMMUNITY)
Admission: EM | Admit: 2016-01-06 | Discharge: 2016-01-06 | Disposition: A | Discharge status: Home / Self Care | Payer: Medicaid Other | Attending: Emergency Medicine | Admitting: Emergency Medicine

## 2016-01-06 ENCOUNTER — Encounter (HOSPITAL_COMMUNITY): Payer: Self-pay

## 2016-01-06 ENCOUNTER — Other Ambulatory Visit: Payer: Self-pay

## 2016-01-06 ENCOUNTER — Emergency Department (HOSPITAL_COMMUNITY): Payer: Medicaid Other

## 2016-01-06 DIAGNOSIS — R0789 Other chest pain: Secondary | ICD-10-CM | POA: Insufficient documentation

## 2016-01-06 DIAGNOSIS — R079 Chest pain, unspecified: Secondary | ICD-10-CM | POA: Diagnosis present

## 2016-01-06 LAB — CBC
HEMATOCRIT: 36.4 % (ref 36.0–46.0)
Hemoglobin: 11.7 g/dL — ABNORMAL LOW (ref 12.0–15.0)
MCH: 29.3 pg (ref 26.0–34.0)
MCHC: 32.1 g/dL (ref 30.0–36.0)
MCV: 91.2 fL (ref 78.0–100.0)
Platelets: 308 10*3/uL (ref 150–400)
RBC: 3.99 MIL/uL (ref 3.87–5.11)
RDW: 13 % (ref 11.5–15.5)
WBC: 12.3 10*3/uL — ABNORMAL HIGH (ref 4.0–10.5)

## 2016-01-06 LAB — I-STAT TROPONIN, ED
Troponin i, poc: 0 ng/mL (ref 0.00–0.08)
Troponin i, poc: 0 ng/mL (ref 0.00–0.08)
Troponin i, poc: 0 ng/mL (ref 0.00–0.08)

## 2016-01-06 LAB — BASIC METABOLIC PANEL
Anion gap: 9 (ref 5–15)
BUN: 15 mg/dL (ref 6–20)
CHLORIDE: 106 mmol/L (ref 101–111)
CO2: 24 mmol/L (ref 22–32)
Calcium: 9.1 mg/dL (ref 8.9–10.3)
Creatinine, Ser: 0.74 mg/dL (ref 0.44–1.00)
GFR calc Af Amer: >60 mL/min (ref >60)
GFR calc non Af Amer: >60 mL/min (ref >60)
GLUCOSE: 102 mg/dL — AB (ref 65–99)
POTASSIUM: 3.4 mmol/L — AB (ref 3.5–5.1)
Sodium: 139 mmol/L (ref 135–145)

## 2016-01-06 MED ORDER — ASPIRIN 81 MG PO CHEW
324.0000 mg | CHEWABLE_TABLET | Freq: Once | ORAL | Status: AC
  Administered 2016-01-06: 324 mg via ORAL
  Filled 2016-01-06: qty 4

## 2016-01-06 MED ORDER — KETOROLAC TROMETHAMINE 30 MG/ML IJ SOLN
30.0000 mg | Freq: Once | INTRAMUSCULAR | Status: AC
  Administered 2016-01-06: 30 mg via INTRAVENOUS
  Filled 2016-01-06: qty 1

## 2016-01-06 MED ORDER — ACETAMINOPHEN 500 MG PO TABS
1000.0000 mg | ORAL_TABLET | Freq: Once | ORAL | Status: AC
  Administered 2016-01-06: 1000 mg via ORAL
  Filled 2016-01-06: qty 2

## 2016-01-06 NOTE — ED Triage Notes (Addendum)
Pt reports she has chest pain that started yesterday. She reports today she began having numbness in her head and into her left arm approximately 2 hrs ago. Pt alert and oriented X4. MD aware, not to call code stroke at this time.

## 2016-01-06 NOTE — ED Provider Notes (Signed)
[facility] Provider Note   CSN: [id number] Arrival date & time: 01/06/16  1516  History   Chief Complaint Chief Complaint  Patient presents with  . Chest Pain  . Numbness    HPI Madison Flowers is a 40 y.o. female.  The history is provided by the patient.  Chest Pain   This is a new problem. The current episode started yesterday. The problem occurs constantly. The problem has been gradually worsening. The pain is associated with movement. The pain is present in the lateral region. The quality of the pain is described as sharp and stabbing. The pain does not radiate. Episode Length: 1. The symptoms are aggravated by certain positions. Associated symptoms include shortness of breath. Pertinent negatives include no abdominal pain, no back pain, no cough, no diaphoresis, no exertional chest pressure, no hemoptysis, no irregular heartbeat, no lower extremity edema, no malaise/fatigue, no nausea, no near-syncope, no numbness, no orthopnea, no palpitations, no sputum production and no vomiting.  Her past medical history is significant for anxiety/panic attacks.    Past Medical History:  Diagnosis Date  . Chronic pelvic pain in female   . GERD (gastroesophageal reflux disease)   . Plantar fasciitis    Patient Active Problem List   Diagnosis Date Noted  . Plantar fasciitis of left foot 03/27/2014  . Decreased libido 03/27/2014  . Dyspareunia 12/12/2013  . Patellar tendon strain 12/12/2013  . Hemorrhoids 12/12/2013    History reviewed. No pertinent surgical history.  OB History    No data available     Home Medications    Prior to Admission medications   Medication Sig Start Date End Date Taking? Authorizing Provider  ALPRAZolam (XANAX) 0.25 MG tablet Take 1 tablet (0.25 mg total) by mouth at bedtime as needed for anxiety. 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 Patient not taking: Reported on 04/08/2014 03/30/14   Dessa PhiJosalyn Funches, MD  cephALEXin (KEFLEX) 500 MG capsule Take 1 capsule (500 mg total) by mouth 4 (four) times daily. 06/08/15   Elpidio AnisShari Upstill, PA-C  diclofenac (VOLTAREN) 75 MG EC tablet Take 1 tablet (75 mg total) by mouth 2 (two) times daily as needed (with food). Patient not taking: Reported on 04/08/2014 03/27/14   Dessa PhiJosalyn Funches, MD  HYDROcodone-acetaminophen (NORCO/VICODIN) 5-325 MG tablet Take 1-2 tablets by mouth every 4 (four) hours as needed. 06/08/15   Elpidio AnisShari Upstill, PA-C  ibuprofen (ADVIL,MOTRIN) 800 MG tablet Take 1 tablet (800 mg total) by mouth 3 (three) times daily. Patient not taking: Reported on 03/30/2015 01/17/15   Mady GemmaElizabeth C Westfall, PA-C  naproxen (NAPROSYN) 500 MG tablet Take 1 tablet (500 mg total) by mouth 2 (two) times daily. 11/01/15   Felicie Mornavid Smith, NP  nitrofurantoin, macrocrystal-monohydrate, (MACROBID) 100 MG capsule Take 1 capsule (100 mg total) by mouth 2 (two) times daily. Patient not taking: Reported on 03/30/2015 04/08/14   Everlene FarrierWilliam Dansie, PA-C    Family History Family History  Problem Relation Age of Onset  . Cancer Mother     ovarian   . Depression Mother   . Diabetes Maternal Uncle   . Heart disease Maternal Uncle   . Heart disease Maternal Grandmother     Social History Social History  Substance Use Topics  . Smoking status: Never Smoker  . Smokeless tobacco: Never Used  . Alcohol use Yes     Comment: occasional  Allergies   Review of patient's allergies indicates no known allergies.   Review of Systems Review of Systems  Constitutional: Negative for diaphoresis and malaise/fatigue.  Respiratory: Positive for shortness of breath. Negative for cough, hemoptysis and sputum production.   Cardiovascular: Positive for chest pain. Negative for palpitations, orthopnea and near-syncope.  Gastrointestinal: Negative for abdominal pain, nausea and vomiting.  Musculoskeletal: Negative  for back pain.  Neurological: Negative for numbness.  All other systems reviewed and are negative.  Physical Exam Updated Vital Signs BP (!) 105/49   Pulse 80   Temp 98.2 F (36.8 C) (Oral)   Resp 21   Ht 5\' 1"  (1.549 m)   Wt 136.1 kg   LMP 12/06/2015 (Within Days)   SpO2 98%   BMI 56.68 kg/m   Physical Exam  Constitutional: She is oriented to person, place, and time. She appears well-developed and well-nourished. No distress.  HENT:  Head: Normocephalic and atraumatic.  Eyes: Pupils are equal, round, and reactive to light.  Neck: Normal range of motion.  Cardiovascular: Normal rate and regular rhythm.   Pulmonary/Chest: Effort normal and breath sounds normal. No respiratory distress. She has no wheezes. She has no rales. She exhibits tenderness.  Abdominal: Soft. She exhibits no distension and no mass. There is no tenderness. There is no rebound and no guarding.  Musculoskeletal: Normal range of motion. She exhibits no edema.  No evidence of DVT  Neurological: She is alert and oriented to person, place, and time. No cranial nerve deficit. She exhibits normal muscle tone. Coordination normal.  Skin: Skin is warm. Capillary refill takes less than 2 seconds. She is not diaphoretic. No erythema. No pallor.  Psychiatric: She has a normal mood and affect. Her behavior is normal.  Nursing note and vitals reviewed.  ED Treatments / Results  Labs (all labs ordered are listed, but only abnormal results are displayed) Labs Reviewed  BASIC METABOLIC PANEL - Abnormal; Notable for the following:       Result Value   Potassium 3.4 (*)    Glucose, Bld 102 (*)    All other components within normal limits  CBC - Abnormal; Notable for the following:    WBC 12.3 (*)    Hemoglobin 11.7 (*)    All other components within normal limits  I-STAT TROPOININ, ED  I-STAT TROPOININ, ED  I-STAT TROPOININ, ED    EKG  EKG Interpretation  Date/Time:  Thursday January 06 2016 15:21:49  EDT Ventricular Rate:  78 PR Interval:  148 QRS Duration: 84 QT Interval:  390 QTC Calculation: 444 R Axis:   19 Text Interpretation:  Normal sinus rhythm Normal ECG No significant change since last tracing Confirmed by LITTLE MD, RACHEL [phone]) on 01/06/2016 4:02:37 PM       Radiology Dg Chest 2 View  Result Date: 01/06/2016 CLINICAL DATA:  Chest pain and shortness of breath starting yesterday. EXAM: CHEST  2 VIEW COMPARISON:  March 19, 2014 FINDINGS: The heart size and mediastinal contours are within normal limits. Both lungs are clear. The visualized skeletal structures are unremarkable. IMPRESSION: No active cardiopulmonary disease. Electronically Signed   By: Sherian Rein M.D.   On: 01/06/2016 16:00   Procedures Procedures (including critical care time)  Medications Ordered in ED Medications - No data to display   Initial Impression / Assessment and Plan / ED Course  I have reviewed the triage vital signs and the nursing notes.  Pertinent labs & imaging results that were available during my care  of the patient were reviewed by me and considered in my medical decision making (see chart for details).  Clinical Course   Patient presents to the ED with left sided chest pain that is sharp, worse with movement and not associated with any additional symptoms other than "numbness in my hair that went away."  No SOB, no nausea, vomiting or diarrhea. Numbness was transient and stopped.   Patient well appearing. Low well's criteria. PERC negative. Doubt PE.   Low HEART score and delta troponin negative.   Afebrile and chest x-ray unremarkable. Doubt PNA.   Patient appears to have musculoskeletal pain and not cardiac in nature.  Patient given toradol with complete resolution of pain.  Recommend PCP follow up, return precautions given and patient discharged home.   Final Clinical Impressions(s) / ED Diagnoses   Final diagnoses:  Atypical chest pain    New  Prescriptions New Prescriptions   No medications on file     Deirdre Peer, MD 01/07/16 0103    Laurence Spates, MD 01/12/16 1650

## 2016-07-26 ENCOUNTER — Encounter: Payer: Self-pay | Admitting: Family Medicine

## 2017-01-03 ENCOUNTER — Emergency Department (HOSPITAL_COMMUNITY)
Admission: EM | Admit: 2017-01-03 | Discharge: 2017-01-04 | Disposition: A | Discharge status: Home / Self Care | Payer: Medicaid Other | Attending: Physician Assistant | Admitting: Physician Assistant

## 2017-01-03 ENCOUNTER — Emergency Department (HOSPITAL_COMMUNITY): Payer: Medicaid Other

## 2017-01-03 DIAGNOSIS — R1013 Epigastric pain: Secondary | ICD-10-CM | POA: Insufficient documentation

## 2017-01-03 LAB — COMPREHENSIVE METABOLIC PANEL
ALBUMIN: 3.8 g/dL (ref 3.5–5.0)
ALT: 13 U/L — ABNORMAL LOW (ref 14–54)
ANION GAP: 8 (ref 5–15)
AST: 15 U/L (ref 15–41)
Alkaline Phosphatase: 66 U/L (ref 38–126)
BUN: 13 mg/dL (ref 6–20)
CHLORIDE: 102 mmol/L (ref 101–111)
CO2: 27 mmol/L (ref 22–32)
Calcium: 8.9 mg/dL (ref 8.9–10.3)
Creatinine, Ser: 0.48 mg/dL (ref 0.44–1.00)
GFR calc Af Amer: >60 mL/min (ref >60)
GLUCOSE: 96 mg/dL (ref 65–99)
POTASSIUM: 3.2 mmol/L — AB (ref 3.5–5.1)
Sodium: 137 mmol/L (ref 135–145)
Total Bilirubin: 1 mg/dL (ref 0.3–1.2)
Total Protein: 8.2 g/dL — ABNORMAL HIGH (ref 6.5–8.1)

## 2017-01-03 LAB — URINALYSIS, ROUTINE W REFLEX MICROSCOPIC
BILIRUBIN URINE: NEGATIVE
Glucose, UA: NEGATIVE mg/dL
KETONES UR: NEGATIVE mg/dL
Leukocytes, UA: NEGATIVE
Nitrite: NEGATIVE
PROTEIN: NEGATIVE mg/dL
Specific Gravity, Urine: 1.021 (ref 1.005–1.030)
pH: 5 (ref 5.0–8.0)

## 2017-01-03 LAB — CBC
HEMATOCRIT: 35.9 % — AB (ref 36.0–46.0)
HEMOGLOBIN: 11.7 g/dL — AB (ref 12.0–15.0)
MCH: 29.5 pg (ref 26.0–34.0)
MCHC: 32.6 g/dL (ref 30.0–36.0)
MCV: 90.4 fL (ref 78.0–100.0)
Platelets: 319 10*3/uL (ref 150–400)
RBC: 3.97 MIL/uL (ref 3.87–5.11)
RDW: 12.5 % (ref 11.5–15.5)
WBC: 13.7 10*3/uL — AB (ref 4.0–10.5)

## 2017-01-03 LAB — LIPASE, BLOOD: Lipase: 20 U/L (ref 11–51)

## 2017-01-03 LAB — POC URINE PREG, ED: PREG TEST UR: NEGATIVE

## 2017-01-03 MED ORDER — MORPHINE SULFATE (PF) 4 MG/ML IV SOLN
4.0000 mg | Freq: Once | INTRAVENOUS | Status: AC
  Administered 2017-01-03: 4 mg via INTRAVENOUS
  Filled 2017-01-03: qty 1

## 2017-01-03 MED ORDER — ONDANSETRON HCL 4 MG/2ML IJ SOLN
4.0000 mg | Freq: Once | INTRAMUSCULAR | Status: AC
  Administered 2017-01-03: 4 mg via INTRAVENOUS
  Filled 2017-01-03: qty 2

## 2017-01-03 NOTE — ED Notes (Signed)
Bed: WA01 Expected date:  Expected time:  Means of arrival:  Comments: 

## 2017-01-03 NOTE — ED Provider Notes (Signed)
[facility]-EMERGENCY DEPT Provider Note   CSN: [id number] Arrival date & time: 01/03/17  1831     History   Chief Complaint No chief complaint on file.   HPI Madison Flowers is a 41 y.o. female.  The history is provided by the patient and medical records.    41 year old female with history of chronic pelvic pain, GERD, plantar fasciitis, presenting to the ED with epigastric abdominal pain.  States this has been ongoing for about 3 weeks now, waxing and waning in severity.  She reports sometimes it is sharp, stabbing pain, other times it is burning.  Pain is not necessarily associated with eating.  She has not had any nausea, vomiting, or diarrhea.  No urinary symptoms.  Bowel movements have been normal.  No prior abdominal surgeries.  States she has tried some Alka-Seltzer and over-the-counter antacids without relief.  Past Medical History:  Diagnosis Date  . Chronic pelvic pain in female   . GERD (gastroesophageal reflux disease)   . Plantar fasciitis     Patient Active Problem List   Diagnosis Date Noted  . Plantar fasciitis of left foot 03/27/2014  . Decreased libido 03/27/2014  . Dyspareunia 12/12/2013  . Patellar tendon strain 12/12/2013  . Hemorrhoids 12/12/2013    No past surgical history on file.  OB History    No data available       Home Medications    Prior to Admission medications   Medication Sig Start Date End Date Taking? Authorizing Provider  ALPRAZolam (XANAX) 0.25 MG tablet Take 1 tablet (0.25 mg total) by mouth at bedtime as needed for anxiety. And insomnia due to bereavement Patient not taking: Reported on 01/06/2016 03/30/15   Jaclyn Shaggy, MD  buPROPion (WELLBUTRIN XL) 150 MG 24 hr tablet Take 1 tablet (150 mg total) by mouth daily. Patient not taking: Reported on 01/06/2016 03/30/14   Dessa Phi, MD  cephALEXin (KEFLEX) 500 MG capsule Take 1 capsule (500 mg total) by mouth 4 (four) times daily. Patient not  taking: Reported on 01/06/2016 06/08/15   Elpidio Anis, PA-C  diclofenac (VOLTAREN) 75 MG EC tablet Take 1 tablet (75 mg total) by mouth 2 (two) times daily as needed (with food). Patient not taking: Reported on 01/06/2016 03/27/14   Dessa Phi, MD  HYDROcodone-acetaminophen (NORCO/VICODIN) 5-325 MG tablet Take 1-2 tablets by mouth every 4 (four) hours as needed. Patient not taking: Reported on 01/06/2016 06/08/15   Elpidio Anis, PA-C  ibuprofen (ADVIL,MOTRIN) 800 MG tablet Take 1 tablet (800 mg total) by mouth 3 (three) times daily. Patient not taking: Reported on 01/06/2016 01/17/15   Mady Gemma, PA-C  naproxen (NAPROSYN) 500 MG tablet Take 1 tablet (500 mg total) by mouth 2 (two) times daily. Patient not taking: Reported on 01/06/2016 11/01/15   Felicie Morn, NP  nitrofurantoin, macrocrystal-monohydrate, (MACROBID) 100 MG capsule Take 1 capsule (100 mg total) by mouth 2 (two) times daily. Patient not taking: Reported on 01/06/2016 04/08/14   Everlene Farrier, PA-C    Family History Family History  Problem Relation Age of Onset  . Cancer Mother        ovarian   . Depression Mother   . Diabetes Maternal Uncle   . Heart disease Maternal Uncle   . Heart disease Maternal Grandmother     Social History Social History  Substance Use Topics  . Smoking status: Never Smoker  . Smokeless tobacco: Never Used  . Alcohol use Yes     Comment: occasional  Allergies   Patient has no known allergies.   Review of Systems Review of Systems  Gastrointestinal: Positive for abdominal pain.  All other systems reviewed and are negative.    Physical Exam Updated Vital Signs BP (!) 150/120 (BP Location: Left Arm)   Pulse 70   Temp 98.1 F (36.7 C) (Oral)   Resp 18   Ht 5\' 1"  (1.549 m)   Wt 126.1 kg (278 lb)   SpO2 100%   BMI 52.53 kg/m   Physical Exam  Constitutional: She is oriented to person, place, and time. She appears well-developed and well-nourished.    Obese, NAD  HENT:  Head: Normocephalic and atraumatic.  Mouth/Throat: Oropharynx is clear and moist.  Eyes: Pupils are equal, round, and reactive to light. Conjunctivae and EOM are normal.  Neck: Normal range of motion.  Cardiovascular: Normal rate, regular rhythm and normal heart sounds.   Pulmonary/Chest: Effort normal and breath sounds normal.  Abdominal: Soft. Bowel sounds are normal. There is tenderness in the right upper quadrant and epigastric area.  Tenderness in the epigastrium and right upper quadrant without positive Murphy sign, no peritoneal signs  Musculoskeletal: Normal range of motion.  Neurological: She is alert and oriented to person, place, and time.  Skin: Skin is warm and dry.  Psychiatric: She has a normal mood and affect.  Nursing note and vitals reviewed.    ED Treatments / Results  Labs (all labs ordered are listed, but only abnormal results are displayed) Labs Reviewed  COMPREHENSIVE METABOLIC PANEL - Abnormal; Notable for the following:       Result Value   Potassium 3.2 (*)    Total Protein 8.2 (*)    ALT 13 (*)    All other components within normal limits  CBC - Abnormal; Notable for the following:    WBC 13.7 (*)    Hemoglobin 11.7 (*)    HCT 35.9 (*)    All other components within normal limits  URINALYSIS, ROUTINE W REFLEX MICROSCOPIC - Abnormal; Notable for the following:    Hgb urine dipstick SMALL (*)    Bacteria, UA RARE (*)    Squamous Epithelial / LPF 6-30 (*)    All other components within normal limits  LIPASE, BLOOD  POC URINE PREG, ED    EKG  EKG Interpretation None       Radiology No results found.  Procedures Procedures (including critical care time)  Medications Ordered in ED Medications  morphine 4 MG/ML injection 4 mg (4 mg Intravenous Given 01/03/17 2336)  ondansetron (ZOFRAN) injection 4 mg (4 mg Intravenous Given 01/03/17 2336)  famotidine (PEPCID) IVPB 20 mg premix (0 mg Intravenous Stopped 01/04/17 0355)   sucralfate (CARAFATE) tablet 1 g (1 g Oral Given 01/04/17 0132)  gi cocktail (Maalox,Lidocaine,Donnatal) (30 mLs Oral Given 01/04/17 0132)  potassium chloride SA (K-DUR,KLOR-CON) CR tablet 40 mEq (40 mEq Oral Given 01/04/17 0132)     Initial Impression / Assessment and Plan / ED Course  I have reviewed the triage vital signs and the nursing notes.  Pertinent labs & imaging results that were available during my care of the patient were reviewed by me and considered in my medical decision making (see chart for details).  41 year old female here with abdominal pain.  Epigastric and right upper quadrant in nature.  Reports pain is sharp and sometimes burning.  Does have history of GERD.  Denies association with food intake.  She is afebrile, nontoxic.  Mild tenderness in the epigastrium and  right upper quadrant without positive Murphy sign.  We will plan for screening labs, ultrasound.  Medications ordered.  1:00 AM Patient currently sleeping in room.  When awoken, reports pain is no better.  Results discussed.  Sx may be related to GERD.  Will try GI cocktail, pepcid IV.  Patient feeling better after additional medications.  She has not had any active emesis.  Vitals are stable.  Feel she is appropriate for discharge home.  We will have her start daily Pepcid.  Follow-up closely with PCP.  Discussed plan with patient, she acknowledged understanding and agreed with plan of care.  Return precautions given for new or worsening symptoms.  Final Clinical Impressions(s) / ED Diagnoses   Final diagnoses:  Abdominal pain, epigastric    New Prescriptions Discharge Medication List as of 01/04/2017  4:10 AM    START taking these medications   Details  famotidine (PEPCID) 20 MG tablet Take 1 tablet (20 mg total) by mouth 2 (two) times daily., Starting Thu 01/04/2017, Print    ondansetron (ZOFRAN ODT) 4 MG disintegrating tablet Take 1 tablet (4 mg total) by mouth every 8 (eight) hours as needed for  nausea., Starting Thu 01/04/2017, Print         Garlon Hatchet, PA-C 01/04/17 1610    Abelino Derrick, MD 01/06/17 [id number]

## 2017-01-04 MED ORDER — FAMOTIDINE IN NACL 20-0.9 MG/50ML-% IV SOLN
20.0000 mg | INTRAVENOUS | Status: AC
  Administered 2017-01-04: 20 mg via INTRAVENOUS
  Filled 2017-01-04: qty 50

## 2017-01-04 MED ORDER — POTASSIUM CHLORIDE CRYS ER 20 MEQ PO TBCR
40.0000 meq | EXTENDED_RELEASE_TABLET | Freq: Once | ORAL | Status: AC
  Administered 2017-01-04: 40 meq via ORAL
  Filled 2017-01-04: qty 2

## 2017-01-04 MED ORDER — SUCRALFATE 1 G PO TABS
1.0000 g | ORAL_TABLET | Freq: Once | ORAL | Status: AC
  Administered 2017-01-04: 1 g via ORAL
  Filled 2017-01-04: qty 1

## 2017-01-04 MED ORDER — GI COCKTAIL ~~LOC~~
30.0000 mL | Freq: Once | ORAL | Status: AC
  Administered 2017-01-04: 30 mL via ORAL
  Filled 2017-01-04: qty 30

## 2017-01-04 MED ORDER — FAMOTIDINE 20 MG PO TABS: 20.0000 mg | ORAL_TABLET | Freq: Two times a day (BID) | ORAL | 0 refills | Status: DC

## 2017-01-04 MED ORDER — ONDANSETRON 4 MG PO TBDP: 4.0000 mg | ORAL_TABLET | Freq: Three times a day (TID) | ORAL | 0 refills | Status: DC | PRN

## 2017-01-04 NOTE — Discharge Instructions (Signed)
Take the prescribed medication as directed. Follow-up with your primary care doctor-- let them know you were seen here, they will be able to review your labs/imaging. Return to the ED for new or worsening symptoms.

## 2017-08-09 ENCOUNTER — Other Ambulatory Visit: Payer: Self-pay

## 2017-08-09 ENCOUNTER — Emergency Department (HOSPITAL_COMMUNITY)
Admission: EM | Admit: 2017-08-09 | Discharge: 2017-08-10 | Disposition: A | Discharge status: Home / Self Care | Payer: Medicaid Other | Attending: Emergency Medicine | Admitting: Emergency Medicine

## 2017-08-09 DIAGNOSIS — M5431 Sciatica, right side: Secondary | ICD-10-CM

## 2017-08-09 DIAGNOSIS — Z79899 Other long term (current) drug therapy: Secondary | ICD-10-CM | POA: Insufficient documentation

## 2017-08-09 LAB — URINALYSIS, ROUTINE W REFLEX MICROSCOPIC
BILIRUBIN URINE: NEGATIVE
GLUCOSE, UA: NEGATIVE mg/dL
KETONES UR: NEGATIVE mg/dL
Nitrite: NEGATIVE
PH: 5 (ref 5.0–8.0)
Protein, ur: NEGATIVE mg/dL
Specific Gravity, Urine: 1.024 (ref 1.005–1.030)

## 2017-08-09 LAB — PREGNANCY, URINE: Preg Test, Ur: NEGATIVE

## 2017-08-09 NOTE — ED Triage Notes (Signed)
Patient c/o right back/flank pain since yesterday. Patient states that she is now unable to walk on right leg due to pain.

## 2017-08-10 MED ORDER — PREDNISONE 20 MG PO TABS: 40.0000 mg | ORAL_TABLET | Freq: Every day | ORAL | 0 refills | Status: DC

## 2017-08-10 MED ORDER — DIAZEPAM 5 MG PO TABS
5.0000 mg | ORAL_TABLET | Freq: Once | ORAL | Status: AC
Start: 2017-08-10 — End: 2017-08-10
  Administered 2017-08-10: 5 mg via ORAL
  Filled 2017-08-10: qty 1

## 2017-08-10 MED ORDER — CYCLOBENZAPRINE HCL 10 MG PO TABS: 10.0000 mg | ORAL_TABLET | Freq: Two times a day (BID) | ORAL | 0 refills | Status: DC | PRN

## 2017-08-10 MED ORDER — KETOROLAC TROMETHAMINE 30 MG/ML IJ SOLN
30.0000 mg | Freq: Once | INTRAMUSCULAR | Status: AC
  Administered 2017-08-10: 30 mg via INTRAMUSCULAR
  Filled 2017-08-10: qty 1

## 2017-08-10 NOTE — ED Provider Notes (Signed)
[facility] EMERGENCY DEPARTMENT Provider Note   CSN: [id number] Arrival date & time: 08/09/17  4098     History   Chief Complaint Chief Complaint  Patient presents with  . Back Pain    Right Flank    HPI Madison Flowers is a 42 y.o. female.  Patient presents to the emergency department with chief complaint of right side pain.  She states that a couple weeks ago she was lifting something for her job and felt pain.  She has had slowly worsening symptoms over the past couple of weeks.  She states that the pain radiates from her right low back into her right buttock and down her right leg. Reports associated burning and tingling. She reports increased pain with ambulation and palpation.  She has not tried taking anything for the symptoms.  She denies any fevers, chills, dysuria, or hematuria.  Denies any other associated symptoms.  The history is provided by the patient. No language interpreter was used.    Past Medical History:  Diagnosis Date  . Chronic pelvic pain in female   . GERD (gastroesophageal reflux disease)   . Plantar fasciitis     Patient Active Problem List   Diagnosis Date Noted  . Plantar fasciitis of left foot 03/27/2014  . Decreased libido 03/27/2014  . Dyspareunia 12/12/2013  . Patellar tendon strain 12/12/2013  . Hemorrhoids 12/12/2013    No past surgical history on file.   OB History   None      Home Medications    Prior to Admission medications   Medication Sig Start Date End Date Taking? Authorizing Provider  ALPRAZolam (XANAX) 0.25 MG tablet Take 1 tablet (0.25 mg total) by mouth at bedtime as needed for anxiety. 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 Patient not taking: Reported on 01/06/2016 03/30/14   Dessa Phi, MD  cephALEXin (KEFLEX) 500 MG  capsule Take 1 capsule (500 mg total) by mouth 4 (four) times daily. Patient not taking: Reported on 01/06/2016 06/08/15   Elpidio Anis, PA-C  cyclobenzaprine (FLEXERIL) 10 MG tablet Take 1 tablet (10 mg total) by mouth 2 (two) times daily as needed for muscle spasms. 08/10/17   Roxy Horseman, PA-C  diclofenac (VOLTAREN) 75 MG EC tablet Take 1 tablet (75 mg total) by mouth 2 (two) times daily as needed (with food). Patient not taking: Reported on 01/06/2016 03/27/14   Dessa Phi, MD  famotidine (PEPCID) 20 MG tablet Take 1 tablet (20 mg total) by mouth 2 (two) times daily. 01/04/17   Garlon Hatchet, PA-C  HYDROcodone-acetaminophen (NORCO/VICODIN) 5-325 MG tablet Take 1-2 tablets by mouth every 4 (four) hours as needed. Patient not taking: Reported on 01/06/2016 06/08/15   Elpidio Anis, PA-C  ibuprofen (ADVIL,MOTRIN) 800 MG tablet Take 1 tablet (800 mg total) by mouth 3 (three) times daily. Patient not taking: Reported on 01/06/2016 01/17/15   Mady Gemma, PA-C  naproxen (NAPROSYN) 500 MG tablet Take 1 tablet (500 mg total) by mouth 2 (two) times daily. Patient not taking: Reported on 01/06/2016 11/01/15   Felicie Morn, NP  naproxen sodium (ANAPROX) 220 MG tablet Take 220 mg by mouth 2 (two) times daily as needed (pain).    [provider]  nitrofurantoin, macrocrystal-monohydrate, (MACROBID) 100 MG capsule Take 1 capsule (100 mg total) by mouth 2 (two) times  daily. Patient not taking: Reported on 01/06/2016 04/08/14   Everlene Farrier, PA-C  ondansetron (ZOFRAN ODT) 4 MG disintegrating tablet Take 1 tablet (4 mg total) by mouth every 8 (eight) hours as needed for nausea. 01/04/17   Garlon Hatchet, PA-C  predniSONE (DELTASONE) 20 MG tablet Take 2 tablets (40 mg total) by mouth daily. 08/10/17   Roxy Horseman, PA-C    Family History Family History  Problem Relation Age of Onset  . Cancer Mother        ovarian   . Depression Mother   . Diabetes Maternal Uncle   .  Heart disease Maternal Uncle   . Heart disease Maternal Grandmother     Social History Social History   Tobacco Use  . Smoking status: Never Smoker  . Smokeless tobacco: Never Used  Substance Use Topics  . Alcohol use: Yes    Comment: occasional  . Drug use: No     Allergies   Patient has no known allergies.   Review of Systems Review of Systems  All other systems reviewed and are negative.    Physical Exam Updated Vital Signs BP 133/73   Pulse (!) 58   Temp 98 F (36.7 C)   Resp 18   Ht  (1.549 m)   Wt 134.3 kg (296 lb)   LMP 07/19/2017 (Approximate)   SpO2 100%   BMI 55.93 kg/m   Physical Exam Physical Exam  Constitutional: Pt appears well-developed and well-nourished. No distress.  HENT:  Head: Normocephalic and atraumatic.  Mouth/Throat: Oropharynx is clear and moist. No oropharyngeal exudate.  Eyes: Conjunctivae are normal.  Neck: Normal range of motion. Neck supple.  No meningismus Cardiovascular: Normal rate, regular rhythm and intact distal pulses.   Pulmonary/Chest: Effort normal and breath sounds normal. No respiratory distress. Pt has no wheezes.  Abdominal: Pt exhibits no distension Musculoskeletal:  Right lumbar paraspinal muscles tender to palpation, no bony CTLS spine tenderness, deformity, step-off, or crepitus Lymphadenopathy: Pt has no cervical adenopathy.  Neurological: Pt is alert and oriented Speech is clear and goal oriented, follows commands Normal 5/5 strength in upper and lower extremities bilaterally including dorsiflexion and plantar flexion, strong and equal grip strength Sensation intact Great toe extension intact Moves extremities without ataxia, coordination intact No Clonus Skin: Skin is warm and dry. No rash noted. Pt is not diaphoretic. No erythema.  Psychiatric: Pt has a normal mood and affect. Behavior is normal.  Nursing note and vitals reviewed.   ED Treatments / Results  Labs (all labs ordered are  listed, but only abnormal results are displayed) Labs Reviewed  URINALYSIS, ROUTINE W REFLEX MICROSCOPIC - Abnormal; Notable for the following components:      Result Value   APPearance HAZY (*)    Hgb urine dipstick SMALL (*)    Leukocytes, UA SMALL (*)    Bacteria, UA FEW (*)    All other components within normal limits  PREGNANCY, URINE  POC URINE PREG, ED    EKG None  Radiology No results found.  Procedures Procedures (including critical care time)  Medications Ordered in ED Medications  ketorolac (TORADOL) 30 MG/ML injection 30 mg (has no administration in time range)  diazepam (VALIUM) tablet 5 mg (has no administration in time range)     Initial Impression / Assessment and Plan / ED Course  I have reviewed the triage vital signs and the nursing notes.  Pertinent labs & imaging results that were available during my care of the patient  were reviewed by me and considered in my medical decision making (see chart for details).     Patient with back pain.    No neurological deficits and normal neuro exam.  Patient is ambulatory.  No loss of bowel or bladder control.  Doubt cauda equina.  Denies fever,  doubt epidural abscess or other lesion. Recommend back exercises, stretching, RICE, and will treat with a short course of prednisone and flexeril.  Encouraged the patient that there could be a need for additional workup and/or imaging such as MRI, if the symptoms do not resolve. Patient advised that if the back pain does not resolve, or radiates, this could progress to more serious conditions and is encouraged to follow-up with PCP or orthopedics within 2 weeks.     Final Clinical Impressions(s) / ED Diagnoses   Final diagnoses:  Sciatica of right side    ED Discharge Orders        Ordered    predniSONE (DELTASONE) 20 MG tablet  Daily     08/10/17 0026    cyclobenzaprine (FLEXERIL) 10 MG tablet  2 times daily PRN     08/10/17 0026       Roxy Horseman,  PA-C 08/10/17 6213    Geoffery Lyons, MD 08/10/17 [phone]

## 2017-08-10 NOTE — ED Notes (Signed)
ED Provider at bedside. 

## 2019-03-31 ENCOUNTER — Encounter (HOSPITAL_COMMUNITY): Payer: Self-pay | Admitting: Emergency Medicine

## 2019-03-31 ENCOUNTER — Emergency Department (HOSPITAL_COMMUNITY)
Admission: EM | Admit: 2019-03-31 | Discharge: 2019-04-01 | Disposition: A | Discharge status: Home / Self Care | Payer: Self-pay | Attending: Emergency Medicine | Admitting: Emergency Medicine

## 2019-03-31 ENCOUNTER — Other Ambulatory Visit: Payer: Self-pay

## 2019-03-31 DIAGNOSIS — Z79899 Other long term (current) drug therapy: Secondary | ICD-10-CM | POA: Insufficient documentation

## 2019-03-31 DIAGNOSIS — M5442 Lumbago with sciatica, left side: Secondary | ICD-10-CM | POA: Insufficient documentation

## 2019-03-31 DIAGNOSIS — M5432 Sciatica, left side: Secondary | ICD-10-CM

## 2019-03-31 NOTE — ED Triage Notes (Signed)
Pt reports her left leg has been hurting for while and been putting weight on right leg and now having pains in right leg and her back. Reports a fall but a long time ago. Denies any urinary problems. "drinkning lots of pain medications and not helping".

## 2019-04-01 ENCOUNTER — Ambulatory Visit (HOSPITAL_COMMUNITY): Payer: Self-pay

## 2019-04-01 ENCOUNTER — Emergency Department (HOSPITAL_COMMUNITY): Payer: Self-pay

## 2019-04-01 ENCOUNTER — Ambulatory Visit (HOSPITAL_BASED_OUTPATIENT_CLINIC_OR_DEPARTMENT_OTHER)
Admission: RE | Admit: 2019-04-01 | Discharge: 2019-04-01 | Disposition: A | Discharge status: Home / Self Care | Payer: Self-pay | Source: Ambulatory Visit | Attending: Emergency Medicine | Admitting: Emergency Medicine

## 2019-04-01 DIAGNOSIS — R609 Edema, unspecified: Secondary | ICD-10-CM

## 2019-04-01 MED ORDER — CYCLOBENZAPRINE HCL 10 MG PO TABS: 10.0000 mg | ORAL_TABLET | Freq: Two times a day (BID) | ORAL | 0 refills | Status: DC | PRN

## 2019-04-01 MED ORDER — OXYCODONE-ACETAMINOPHEN 5-325 MG PO TABS
1.0000 | ORAL_TABLET | Freq: Once | ORAL | Status: AC
  Administered 2019-04-01: 1 via ORAL
  Filled 2019-04-01: qty 1

## 2019-04-01 MED ORDER — HYDROCODONE-ACETAMINOPHEN 5-325 MG PO TABS: 1.0000 | ORAL_TABLET | Freq: Every evening | ORAL | 0 refills | Status: DC | PRN

## 2019-04-01 MED ORDER — NAPROXEN 500 MG PO TABS: 500.0000 mg | ORAL_TABLET | Freq: Two times a day (BID) | ORAL | 0 refills | Status: DC

## 2019-04-01 MED ORDER — METHYLPREDNISOLONE 4 MG PO TBPK: ORAL_TABLET | ORAL | 0 refills | Status: DC

## 2019-04-01 NOTE — Progress Notes (Signed)
Lower extremity venous has been completed.   Preliminary results in CV Proc.   Blanch Media 04/01/2019 3:44 PM

## 2019-04-01 NOTE — ED Provider Notes (Signed)
[facility] DEPT Provider Note   CSN: [id number] Arrival date & time: 03/31/19  1741     History Chief Complaint  Patient presents with  . Back Pain  . Leg Pain    Madison Flowers is a 44 y.o. female.  The history is provided by the patient and medical records.  Back Pain Associated symptoms: leg pain   Leg Pain Associated symptoms: back pain    Madison Flowers is a 44 y.o. female who presents to the Emergency Department complaining of back pain and leg pain. She has been experiencing low back pain radiating down her left leg for several months. Over the last week she is started to experience some pain on her right side. She feels like she is favoring her right leg due to pain in the left leg. She is also complaining of pain to the back of her left knee as well as swelling to the left leg. Pain is worse with extending the leg. She has a burning sensation to the left lower extremity. She has difficulty ambulating and bearing weight secondary to pain. No dysuria, urinary incontinence, numbness. Denies any fevers, Donald pain, nausea, vomiting. She has been taking ibuprofen today with no significant improvement in symptoms.    Past Medical History:  Diagnosis Date  . Chronic pelvic pain in female   . GERD (gastroesophageal reflux disease)   . Plantar fasciitis     Patient Active Problem List   Diagnosis Date Noted  . Plantar fasciitis of left foot 03/27/2014  . Decreased libido 03/27/2014  . Dyspareunia 12/12/2013  . Patellar tendon strain 12/12/2013  . Hemorrhoids 12/12/2013    History reviewed. No pertinent surgical history.   OB History   No obstetric history on file.     Family History  Problem Relation Age of Onset  . Cancer Mother        ovarian   . Depression Mother   . Diabetes Maternal Uncle   . Heart disease Maternal Uncle   . Heart disease Maternal Grandmother     Social History   Tobacco Use  .  Smoking status: Never Smoker  . Smokeless tobacco: Never Used  Substance Use Topics  . Alcohol use: Yes    Comment: occasional  . Drug use: No    Home Medications Prior to Admission medications   Medication Sig Start Date End Date Taking? Authorizing Provider  ALPRAZolam (XANAX) 0.25 MG tablet Take 1 tablet (0.25 mg total) by mouth at bedtime as needed for anxiety. And insomnia due to bereavement Patient not taking: Reported on 01/06/2016 03/30/15   Charlott Rakes, MD  buPROPion (WELLBUTRIN XL) 150 MG 24 hr tablet Take 1 tablet (150 mg total) by mouth daily. Patient not taking: Reported on 01/06/2016 03/30/14   Boykin Nearing, MD  cephALEXin (KEFLEX) 500 MG capsule Take 1 capsule (500 mg total) by mouth 4 (four) times daily. Patient not taking: Reported on 01/06/2016 06/08/15   Charlann Lange, PA-C  cyclobenzaprine (FLEXERIL) 10 MG tablet Take 1 tablet (10 mg total) by mouth 2 (two) times daily as needed for muscle spasms. 04/01/19   Quintella Reichert, MD  diclofenac (VOLTAREN) 75 MG EC tablet Take 1 tablet (75 mg total) by mouth 2 (two) times daily as needed (with food). Patient not taking: Reported on 01/06/2016 03/27/14   Boykin Nearing, MD  famotidine (PEPCID) 20 MG tablet Take 1 tablet (20 mg total) by mouth 2 (two) times daily. 01/04/17   Larene Pickett, PA-C  HYDROcodone-acetaminophen (NORCO/VICODIN) 5-325 MG tablet Take 1 tablet by mouth at bedtime as needed for severe pain. 04/01/19   Tilden Fossa, MD  methylPREDNISolone (MEDROL DOSEPAK) 4 MG TBPK tablet Take according to label instructions 04/01/19   Tilden Fossa, MD  naproxen (NAPROSYN) 500 MG tablet Take 1 tablet (500 mg total) by mouth 2 (two) times daily with a meal. 04/01/19   Tilden Fossa, MD  nitrofurantoin, macrocrystal-monohydrate, (MACROBID) 100 MG capsule Take 1 capsule (100 mg total) by mouth 2 (two) times daily. Patient not taking: Reported on 01/06/2016 04/08/14   Everlene Farrier, PA-C  ondansetron (ZOFRAN ODT) 4  MG disintegrating tablet Take 1 tablet (4 mg total) by mouth every 8 (eight) hours as needed for nausea. 01/04/17   Garlon Hatchet, PA-C    Allergies    Patient has no known allergies.  Review of Systems   Review of Systems  Musculoskeletal: Positive for back pain.  All other systems reviewed and are negative.   Physical Exam Updated Vital Signs BP 124/63 (BP Location: Right Arm)   Pulse 83   Temp 98.7 F (37.1 C) (Oral)   Resp 18   LMP 03/17/2019 Comment: No chance of pregnancy  SpO2 99%   Physical Exam Vitals and nursing note reviewed.  Constitutional:      Appearance: She is well-developed.  HENT:     Head: Normocephalic and atraumatic.  Cardiovascular:     Rate and Rhythm: Normal rate and regular rhythm.     Heart sounds: No murmur.  Pulmonary:     Effort: Pulmonary effort is normal. No respiratory distress.     Breath sounds: Normal breath sounds.  Abdominal:     Palpations: Abdomen is soft.     Tenderness: There is no abdominal tenderness. There is no guarding or rebound.  Musculoskeletal:     Comments: No midline lumbar tenderness to palpation. There is trace edema to the left lower extremity. 2+ DP pulses bilaterally. There is pain to the back of the left knee with extension. There is no anterior tenderness to palpation. Range of motion is intact and the knee. No hip tenderness to palpation.  Skin:    General: Skin is warm and dry.  Neurological:     Mental Status: She is alert and oriented to person, place, and time.     Comments: Five out of five strength and bilateral lower extremities with sensation to light touch intact and bilateral lower extremities. One plus patellar reflexes bilaterally.  Psychiatric:        Behavior: Behavior normal.     ED Results / Procedures / Treatments   Labs (all labs ordered are listed, but only abnormal results are displayed) Labs Reviewed - No data to display  EKG None  Radiology DG Lumbar Spine Complete  Result  Date: 04/01/2019 CLINICAL DATA:  Pain EXAM: LUMBAR SPINE - COMPLETE 4+ VIEW COMPARISON:  None. FINDINGS: There is no acute displaced fracture. No dislocation. Mild multilevel degenerative changes are noted throughout the thoracolumbar spine. There is facet arthrosis at the lower lumbar segments. An IUD projects over the patient's pelvis. IMPRESSION: No acute displaced fracture or dislocation. Mild multilevel degenerative changes throughout the thoracolumbar spine. Electronically Signed   By: Katherine Mantle M.D.   On: 04/01/2019 00:49   DG Knee Complete 4 Views Left  Result Date: 04/01/2019 CLINICAL DATA:  Back pain.  Knee pain. EXAM: LEFT KNEE - COMPLETE 4+ VIEW COMPARISON:  None. FINDINGS: There are mild tricompartmental degenerative changes, greatest within the  medial compartment. There is no significant joint effusion. There is no acute displaced fracture or dislocation. IMPRESSION: Negative. Electronically Signed   By: Katherine Mantle M.D.   On: 04/01/2019 00:48    Procedures Procedures (including critical care time)  Medications Ordered in ED Medications  oxyCODONE-acetaminophen (PERCOCET/ROXICET) 5-325 MG per tablet 1 tablet (has no administration in time range)    ED Course  I have reviewed the triage vital signs and the nursing notes.  Pertinent labs & imaging results that were available during my care of the patient were reviewed by me and considered in my medical decision making (see chart for details).    MDM Rules/Calculators/A&P                     patient here for evaluation of progressive low back pain with radicular pain to her left lower extremity. She is now complaining of one week of pain on the right side that she feels is due to her overcompensating due to the pain on her left. She also complains of worsening pain behind her left knee as well as swelling to her left leg. She is non-toxic appearing on evaluation. No focal neurologic deficits. She is well perfused on  examination. Presentation is not consistent with cauda equina. Imaging is negative for acute serious abnormality. Discussed with patient importance of return to the emergency department tomorrow for vascular ultrasound of the lower extremity due to her lower extremity edema. Discussed home care for sciatica. Discussed outpatient follow-up and return precautions.  Final Clinical Impression(s) / ED Diagnoses Final diagnoses:  Sciatica of left side    Rx / DC Orders ED Discharge Orders         Ordered    LE VENOUS     04/01/19 0026    HYDROcodone-acetaminophen (NORCO/VICODIN) 5-325 MG tablet  At bedtime PRN     04/01/19 0100    cyclobenzaprine (FLEXERIL) 10 MG tablet  2 times daily PRN     04/01/19 0100    methylPREDNISolone (MEDROL DOSEPAK) 4 MG TBPK tablet     04/01/19 0100    naproxen (NAPROSYN) 500 MG tablet  2 times daily with meals     04/01/19 0100           Tilden Fossa, MD 04/01/19 [phone]

## 2019-04-09 ENCOUNTER — Other Ambulatory Visit: Payer: Self-pay | Admitting: Orthopedic Surgery

## 2019-04-09 DIAGNOSIS — M25562 Pain in left knee: Secondary | ICD-10-CM

## 2019-04-10 ENCOUNTER — Ambulatory Visit: Payer: BC Managed Care – PPO | Attending: Internal Medicine | Admitting: Physical Therapy

## 2019-04-19 ENCOUNTER — Ambulatory Visit
Admission: RE | Admit: 2019-04-19 | Discharge: 2019-04-19 | Disposition: A | Discharge status: Home / Self Care | Payer: BC Managed Care – PPO | Source: Ambulatory Visit | Attending: Orthopedic Surgery | Admitting: Orthopedic Surgery

## 2019-04-19 ENCOUNTER — Other Ambulatory Visit: Payer: Self-pay

## 2019-04-19 DIAGNOSIS — M25562 Pain in left knee: Secondary | ICD-10-CM

## 2019-04-23 ENCOUNTER — Ambulatory Visit: Payer: BC Managed Care – PPO | Attending: Internal Medicine | Admitting: Physical Therapy

## 2019-04-24 ENCOUNTER — Ambulatory Visit: Payer: BC Managed Care – PPO | Admitting: Physical Therapy

## 2019-04-29 NOTE — H&P (Signed)
[facility] [address]   [address] [place] [address] [phone] A Division of [facility]                                                                  RE: DILARA, NAVARRETE    [phone]   [id number]  04-23-19 Reason for visit: Follow up MRI of the left knee.   History of present illness: She has known moderate to severe arthritis of the right knee.  On the left side her joint space is well maintained, but she had mechanical symptoms.  MRI was obtained which has demonstrated a tear of the lateral meniscus.  Some mild chondral changes.  She has had persistent mechanical symptoms and pain with the lateral knee.    EXAMINATION: Well appearing female in no apparent distress.  Stable ligament exam of the left knee.  Tenderness at her lateral joint line.  Neurovascularly intact.    X-RAYS: MRI noted above.  ASSESSMENT/PLAN: I had a long talk with her about her options.  Counseled her on weight loss and we are going to set her up with Dr. Dalbert Garnet, as she is likely going to need an arthroplasty on the right at some point.  Discussed the risks and benefits of arthroscopic meniscectomy with chondroplasty of the left knee and she would like to go forward with that.    Jewel Baize.  Eulah Pont, M.D.  Electronically verified by Jewel Baize. Eulah Pont, M.D. TDM:jjh D 04-24-19 T 04-28-19

## 2019-05-05 NOTE — Patient Instructions (Signed)
DUE TO COVID-19 ONLY ONE VISITOR IS ALLOWED TO COME WITH YOU AND STAY IN THE WAITING ROOM ONLY DURING PRE OP AND PROCEDURE DAY OF SURGERY. THE 1 VISITOR MAY VISIT WITH YOU AFTER SURGERY IN YOUR PRIVATE ROOM DURING VISITING HOURS ONLY!  YOU NEED TO HAVE A COVID 19 TEST ON_2/26______ @_9 :35______, THIS TEST MUST BE DONE BEFORE SURGERY, COME  [address],  [place] , .  [facility]) ONCE YOUR COVID TEST IS COMPLETED, PLEASE BEGIN THE QUARANTINE INSTRUCTIONS AS OUTLINED IN YOUR HANDOUT.                Madison Flowers    Your procedure is scheduled on: 05/13/19   Report to [facility] Main  Entrance   Report to admitting at  8:00 AM     Call this number if you have problems the morning of surgery [phone]    BRUSH YOUR TEETH MORNING OF SURGERY AND RINSE YOUR MOUTH OUT, NO CHEWING GUM CANDY OR MINTS.   Do not eat food After Midnight.   YOU MAY HAVE CLEAR LIQUIDS FROM MIDNIGHT UNTIL 7 AM.   At 7 AM Please finish the prescribed Pre-Surgery  drink  . Nothing by mouth after you finish the drink !   Take these medicines the morning of surgery with A SIP OF WATER: None              You may not have any metal on your body including hair pins and              piercings  Do not wear jewelry, make-up, lotions, powders or perfumes, deodorant       Do not bring valuables to the hospital. [facility] IS NOT             RESPONSIBLE   FOR VALUABLES.  Contacts, dentures or bridgework may not be worn into surgery.      Patients discharged the day of surgery will not be allowed to drive home.   IF YOU ARE HAVING SURGERY AND GOING HOME THE SAME DAY, YOU MUST HAVE AN ADULT TO DRIVE YOU HOME AND BE WITH YOU FOR 24 HOURS.  YOU MAY GO HOME BY TAXI OR UBER OR ORTHERWISE, BUT AN ADULT MUST ACCOMPANY YOU HOME AND STAY WITH YOU FOR 24 HOURS.  Name and phone number of your driver:  Special Instructions: N/A              Please read over the following fact  sheets you were given: _____________________________________________________________________             [facility] - Preparing for Surgery  Before surgery, you can play an important role.   Because skin is not sterile, your skin needs to be as free of germs as possible.   You can reduce the number of germs on your skin by washing with CHG (chlorahexidine gluconate) soap before surgery.   CHG is an antiseptic cleaner which kills germs and bonds with the skin to continue killing germs even after washing. Please DO NOT use if you have an allergy to CHG or antibacterial soaps   If your skin becomes reddened/irritated stop using the CHG and inform your nurse when you arrive at Short Stay. Do not shave (including legs and underarms) for at least 48 hours prior to the first CHG shower.    Please follow these instructions carefully:  1.  Shower with CHG Soap the night before surgery and the  morning  of Surgery.  2.  If you choose to wash your hair, wash your hair first as usual with your  normal  shampoo.  3.  After you shampoo, rinse your hair and body thoroughly to remove the  shampoo.                                        4.  Use CHG as you would any other liquid soap.  You can apply chg directly  to the skin and wash                       Gently with a scrungie or clean washcloth.  5.  Apply the CHG Soap to your body ONLY FROM THE NECK DOWN.   Do not use on face/ open                           Wound or open sores. Avoid contact with eyes, ears mouth and genitals (private parts).                       Wash face,  Genitals (private parts) with your normal soap.             6.  Wash thoroughly, paying special attention to the area where your surgery  will be performed.  7.  Thoroughly rinse your body with warm water from the neck down.  8.  DO NOT shower/wash with your normal soap after using and rinsing off  the CHG Soap.             9.  Pat yourself dry with a clean towel.            10.   Wear clean pajamas.            11.  Place clean sheets on your bed the night of your first shower and do not  sleep with pets. Day of Surgery : Do not apply any lotions/deodorants the morning of surgery.  Please wear clean clothes to the hospital/surgery center.  FAILURE TO FOLLOW THESE INSTRUCTIONS MAY RESULT IN THE CANCELLATION OF YOUR SURGERY PATIENT SIGNATURE_________________________________  NURSE SIGNATURE__________________________________  ________________________________________________________________________   [name]  An incentive spirometer is a tool that can help keep your lungs clear and active. This tool measures how well you are filling your lungs with each breath. Taking long deep breaths may help reverse or decrease the chance of developing breathing (pulmonary) problems (especially infection) following:  A long period of time when you are unable to move or be active. BEFORE THE PROCEDURE   If the spirometer includes an indicator to show your best effort, your nurse or respiratory therapist will set it to a desired goal.  If possible, sit up straight or lean slightly forward. Try not to slouch.  Hold the incentive spirometer in an upright position. INSTRUCTIONS FOR USE  1. Sit on the edge of your bed if possible, or sit up as far as you can in bed or on a chair. 2. Hold the incentive spirometer in an upright position. 3. Breathe out normally. 4. Place the mouthpiece in your mouth and seal your lips tightly around it. 5. Breathe in slowly and as deeply as possible, raising the piston or the ball toward the top of the column. 6. Hold your breath  for 3-5 seconds or for as long as possible. Allow the piston or ball to fall to the bottom of the column. 7. Remove the mouthpiece from your mouth and breathe out normally. 8. Rest for a few seconds and repeat Steps 1 through 7 at least 10 times every 1-2 hours when you are awake. Take your time and take a few  normal breaths between deep breaths. 9. The spirometer may include an indicator to show your best effort. Use the indicator as a goal to work toward during each repetition. 10. After each set of 10 deep breaths, practice coughing to be sure your lungs are clear. If you have an incision (the cut made at the time of surgery), support your incision when coughing by placing a pillow or rolled up towels firmly against it. Once you are able to get out of bed, walk around indoors and cough well. You may stop using the incentive spirometer when instructed by your caregiver.  RISKS AND COMPLICATIONS  Take your time so you do not get dizzy or light-headed.  If you are in pain, you may need to take or ask for pain medication before doing incentive spirometry. It is harder to take a deep breath if you are having pain. AFTER USE  Rest and breathe slowly and easily.  It can be helpful to keep track of a log of your progress. Your caregiver can provide you with a simple table to help with this. If you are using the spirometer at home, follow these instructions: [place] IF:   You are having difficultly using the spirometer.  You have trouble using the spirometer as often as instructed.  Your pain medication is not giving enough relief while using the spirometer.  You develop fever of 100.5 F (38.1 C) or higher. SEEK IMMEDIATE MEDICAL CARE IF:   You cough up bloody sputum that had not been present before.  You develop fever of 102 F (38.9 C) or greater.  You develop worsening pain at or near the incision site. MAKE SURE YOU:   Understand these instructions.  Will watch your condition.  Will get help right away if you are not doing well or get worse. Document Released: 07/10/2006 Document Revised: 05/22/2011 Document Reviewed: 09/10/2006 [facility] Patient Information 2014 [place], [place].   ________________________________________________________________________

## 2019-05-06 ENCOUNTER — Other Ambulatory Visit: Payer: Self-pay

## 2019-05-06 ENCOUNTER — Encounter (HOSPITAL_COMMUNITY): Payer: Self-pay

## 2019-05-06 ENCOUNTER — Encounter (HOSPITAL_COMMUNITY)
Admission: RE | Admit: 2019-05-06 | Discharge: 2019-05-06 | Disposition: A | Discharge status: Home / Self Care | Payer: BC Managed Care – PPO | Source: Ambulatory Visit | Attending: Orthopedic Surgery | Admitting: Orthopedic Surgery

## 2019-05-06 DIAGNOSIS — Z01812 Encounter for preprocedural laboratory examination: Secondary | ICD-10-CM | POA: Insufficient documentation

## 2019-05-06 HISTORY — DX: Unspecified osteoarthritis, unspecified site: M19.90

## 2019-05-06 LAB — CBC
HCT: 39.3 % (ref 36.0–46.0)
Hemoglobin: 12.6 g/dL (ref 12.0–15.0)
MCH: 30 pg (ref 26.0–34.0)
MCHC: 32.1 g/dL (ref 30.0–36.0)
MCV: 93.6 fL (ref 80.0–100.0)
Platelets: 317 10*3/uL (ref 150–400)
RBC: 4.2 MIL/uL (ref 3.87–5.11)
RDW: 12.2 % (ref 11.5–15.5)
WBC: 9.9 10*3/uL (ref 4.0–10.5)
nRBC: 0 % (ref 0.0–0.2)

## 2019-05-06 NOTE — Progress Notes (Signed)
PCP - Dr. Greggory Stallion" at [facility] in [place]. Cardiologist - none  Chest x-ray - no EKG - no Stress Test - no ECHO - no Cardiac Cath - no  Sleep Study - NA CPAP -   Fasting Blood Sugar - NA Checks Blood Sugar _____ times a day  Blood Thinner Instructions:NA Aspirin Instructions: Last Dose:  Anesthesia review:   Patient denies shortness of breath, fever, cough and chest pain at PAT appointment yes  Patient verbalized understanding of instructions that were given to them at the PAT appointment. Patient was also instructed that they will need to review over the PAT instructions again at home before surgery. Yes BMI 52.93

## 2019-05-09 ENCOUNTER — Other Ambulatory Visit (HOSPITAL_COMMUNITY): Payer: BC Managed Care – PPO

## 2019-05-09 ENCOUNTER — Other Ambulatory Visit (HOSPITAL_COMMUNITY)
Admission: RE | Admit: 2019-05-09 | Discharge: 2019-05-09 | Disposition: A | Discharge status: Home / Self Care | Payer: BC Managed Care – PPO | Source: Ambulatory Visit | Attending: Orthopedic Surgery | Admitting: Orthopedic Surgery

## 2019-05-09 DIAGNOSIS — Z20822 Contact with and (suspected) exposure to covid-19: Secondary | ICD-10-CM | POA: Diagnosis not present

## 2019-05-09 DIAGNOSIS — Z01812 Encounter for preprocedural laboratory examination: Secondary | ICD-10-CM | POA: Insufficient documentation

## 2019-05-09 LAB — SARS CORONAVIRUS 2 (TAT 6-24 HRS): SARS Coronavirus 2: NEGATIVE

## 2019-05-12 MED ORDER — DEXTROSE 5 % IV SOLN
3.0000 g | INTRAVENOUS | Status: AC
  Administered 2019-05-13: 3 g via INTRAVENOUS
  Filled 2019-05-12: qty 3

## 2019-05-13 ENCOUNTER — Ambulatory Visit (HOSPITAL_COMMUNITY): Payer: BC Managed Care – PPO | Admitting: Physician Assistant

## 2019-05-13 ENCOUNTER — Ambulatory Visit (HOSPITAL_COMMUNITY)
Admission: RE | Admit: 2019-05-13 | Discharge: 2019-05-13 | Disposition: A | Discharge status: Home / Self Care | Payer: BC Managed Care – PPO | Source: Ambulatory Visit | Attending: Orthopedic Surgery | Admitting: Orthopedic Surgery

## 2019-05-13 ENCOUNTER — Encounter (HOSPITAL_COMMUNITY): Payer: Self-pay | Admitting: Orthopedic Surgery

## 2019-05-13 ENCOUNTER — Ambulatory Visit (HOSPITAL_COMMUNITY): Payer: BC Managed Care – PPO | Admitting: Anesthesiology

## 2019-05-13 ENCOUNTER — Encounter (HOSPITAL_COMMUNITY)
Admission: RE | Disposition: A | Discharge status: Home / Self Care | Payer: Self-pay | Source: Ambulatory Visit | Attending: Orthopedic Surgery

## 2019-05-13 DIAGNOSIS — M2342 Loose body in knee, left knee: Secondary | ICD-10-CM | POA: Insufficient documentation

## 2019-05-13 DIAGNOSIS — Z6841 Body Mass Index (BMI) 40.0 and over, adult: Secondary | ICD-10-CM | POA: Insufficient documentation

## 2019-05-13 DIAGNOSIS — M1711 Unilateral primary osteoarthritis, right knee: Secondary | ICD-10-CM | POA: Diagnosis not present

## 2019-05-13 DIAGNOSIS — S83282A Other tear of lateral meniscus, current injury, left knee, initial encounter: Secondary | ICD-10-CM | POA: Insufficient documentation

## 2019-05-13 DIAGNOSIS — S83204A Other tear of unspecified meniscus, current injury, left knee, initial encounter: Secondary | ICD-10-CM

## 2019-05-13 DIAGNOSIS — X58XXXA Exposure to other specified factors, initial encounter: Secondary | ICD-10-CM | POA: Insufficient documentation

## 2019-05-13 HISTORY — PX: KNEE ARTHROSCOPY WITH LATERAL MENISECTOMY: SHX6193

## 2019-05-13 LAB — PREGNANCY, URINE: Preg Test, Ur: NEGATIVE

## 2019-05-13 SURGERY — ARTHROSCOPY, KNEE, WITH LATERAL MENISCECTOMY
Anesthesia: General | Site: Knee | Laterality: Left

## 2019-05-13 MED ORDER — OXYCODONE HCL 5 MG/5ML PO SOLN: 5.0000 mg | Freq: Once | ORAL | Status: AC | PRN

## 2019-05-13 MED ORDER — FENTANYL CITRATE (PF) 100 MCG/2ML IJ SOLN
INTRAMUSCULAR | Status: DC | PRN
  Administered 2019-05-13: 100 ug via INTRAVENOUS
  Administered 2019-05-13: 25 ug via INTRAVENOUS
  Administered 2019-05-13: 50 ug via INTRAVENOUS
  Administered 2019-05-13: 25 ug via INTRAVENOUS

## 2019-05-13 MED ORDER — HYDROCODONE-ACETAMINOPHEN 5-325 MG PO TABS: 1.0000 | ORAL_TABLET | Freq: Four times a day (QID) | ORAL | 0 refills | Status: AC | PRN

## 2019-05-13 MED ORDER — ONDANSETRON HCL 4 MG PO TABS: 4.0000 mg | ORAL_TABLET | Freq: Three times a day (TID) | ORAL | 0 refills | Status: DC | PRN

## 2019-05-13 MED ORDER — BUPIVACAINE HCL (PF) 0.5 % IJ SOLN
INTRAMUSCULAR | Status: AC
  Filled 2019-05-13: qty 30

## 2019-05-13 MED ORDER — DEXMEDETOMIDINE HCL IN NACL 200 MCG/50ML IV SOLN
INTRAVENOUS | Status: AC
  Filled 2019-05-13: qty 100

## 2019-05-13 MED ORDER — LIDOCAINE 2% (20 MG/ML) 5 ML SYRINGE
INTRAMUSCULAR | Status: DC | PRN
  Administered 2019-05-13: 100 mg via INTRAVENOUS

## 2019-05-13 MED ORDER — LACTATED RINGERS IV SOLN: INTRAVENOUS | Status: DC

## 2019-05-13 MED ORDER — MIDAZOLAM HCL 2 MG/2ML IJ SOLN
INTRAMUSCULAR | Status: AC
  Filled 2019-05-13: qty 2

## 2019-05-13 MED ORDER — CHLORHEXIDINE GLUCONATE 4 % EX LIQD: 60.0000 mL | Freq: Once | CUTANEOUS | Status: DC

## 2019-05-13 MED ORDER — ASPIRIN EC 81 MG PO TBEC: 81.0000 mg | DELAYED_RELEASE_TABLET | Freq: Two times a day (BID) | ORAL | 0 refills | Status: DC

## 2019-05-13 MED ORDER — MIDAZOLAM HCL 5 MG/5ML IJ SOLN
INTRAMUSCULAR | Status: DC | PRN
  Administered 2019-05-13: 2 mg via INTRAVENOUS

## 2019-05-13 MED ORDER — FENTANYL CITRATE (PF) 250 MCG/5ML IJ SOLN
INTRAMUSCULAR | Status: AC
  Filled 2019-05-13: qty 5

## 2019-05-13 MED ORDER — ACETAMINOPHEN 500 MG PO TABS
1000.0000 mg | ORAL_TABLET | Freq: Once | ORAL | Status: AC
  Administered 2019-05-13: 1000 mg via ORAL
  Filled 2019-05-13: qty 2

## 2019-05-13 MED ORDER — PROPOFOL 10 MG/ML IV BOLUS
INTRAVENOUS | Status: DC | PRN
  Administered 2019-05-13: 300 mg via INTRAVENOUS

## 2019-05-13 MED ORDER — DEXAMETHASONE SODIUM PHOSPHATE 10 MG/ML IJ SOLN
INTRAMUSCULAR | Status: DC | PRN
  Administered 2019-05-13: 10 mg via INTRAVENOUS

## 2019-05-13 MED ORDER — SODIUM CHLORIDE 0.9 % IR SOLN
Status: DC | PRN
  Administered 2019-05-13 (×2): 3000 mL

## 2019-05-13 MED ORDER — OXYCODONE HCL 5 MG PO TABS: 5.0000 mg | ORAL_TABLET | Freq: Once | ORAL | Status: AC | PRN

## 2019-05-13 MED ORDER — FENTANYL CITRATE (PF) 100 MCG/2ML IJ SOLN: 25.0000 ug | INTRAMUSCULAR | Status: DC | PRN

## 2019-05-13 MED ORDER — METHYLPREDNISOLONE ACETATE 80 MG/ML IJ SUSP
INTRAMUSCULAR | Status: DC | PRN
  Administered 2019-05-13: 80 mg

## 2019-05-13 MED ORDER — BUPIVACAINE HCL (PF) 0.5 % IJ SOLN
INTRAMUSCULAR | Status: DC | PRN
  Administered 2019-05-13: 10 mL

## 2019-05-13 MED ORDER — METHYLPREDNISOLONE ACETATE 40 MG/ML IJ SUSP
INTRAMUSCULAR | Status: AC
  Filled 2019-05-13: qty 2

## 2019-05-13 MED ORDER — ONDANSETRON HCL 4 MG/2ML IJ SOLN
INTRAMUSCULAR | Status: DC | PRN
  Administered 2019-05-13: 4 mg via INTRAVENOUS

## 2019-05-13 MED ORDER — ONDANSETRON HCL 4 MG/2ML IJ SOLN: 4.0000 mg | Freq: Once | INTRAMUSCULAR | Status: DC | PRN

## 2019-05-13 MED ORDER — OXYCODONE HCL 5 MG PO TABS
ORAL_TABLET | ORAL | Status: AC
  Administered 2019-05-13: 5 mg via ORAL
  Filled 2019-05-13: qty 1

## 2019-05-13 SURGICAL SUPPLY — 47 items
BANDAGE ESMARK 6X9 LF (GAUZE/BANDAGES/DRESSINGS) IMPLANT
BLADE CLIPPER SURG (BLADE) IMPLANT
BLADE SURG SZ11 CARB STEEL (BLADE) ×1 IMPLANT
BNDG ELASTIC 6X5.8 VLCR STR LF (GAUZE/BANDAGES/DRESSINGS) IMPLANT
BNDG ESMARK 6X9 LF (GAUZE/BANDAGES/DRESSINGS)
CHLORAPREP W/TINT 26 (MISCELLANEOUS) ×2 IMPLANT
CNTNR URN SCR LID CUP LEK RST (MISCELLANEOUS) IMPLANT
CONT SPEC 4OZ STRL OR WHT (MISCELLANEOUS) ×1
COVER SURGICAL LIGHT HANDLE (MISCELLANEOUS) ×2 IMPLANT
COVER WAND RF STERILE (DRAPES) IMPLANT
CUFF TOURN SGL QUICK 18X4 (TOURNIQUET CUFF) IMPLANT
CUFF TOURN SGL QUICK 34 (TOURNIQUET CUFF) ×1
CUFF TRNQT CYL 34X4.125X (TOURNIQUET CUFF) ×1 IMPLANT
DISSECTOR  3.8MM X 13CM (MISCELLANEOUS) ×1
DISSECTOR 3.8MM X 13CM (MISCELLANEOUS) IMPLANT
DRAPE SHEET LG 3/4 BI-LAMINATE (DRAPES) ×2 IMPLANT
DRSG PAD ABDOMINAL 8X10 ST (GAUZE/BANDAGES/DRESSINGS) IMPLANT
EXCALIBUR 3.8MM X 13CM (MISCELLANEOUS) ×1 IMPLANT
FACESHIELD WRAPAROUND (MASK) ×2 IMPLANT
FACESHIELD WRAPAROUND OR TEAM (MASK) ×1 IMPLANT
GAUZE SPONGE 4X4 12PLY STRL (GAUZE/BANDAGES/DRESSINGS) IMPLANT
GAUZE XEROFORM 1X8 LF (GAUZE/BANDAGES/DRESSINGS) ×2 IMPLANT
GLOVE BIO SURGEON STRL SZ7.5 (GLOVE) ×4 IMPLANT
GLOVE BIOGEL PI IND STRL 8 (GLOVE) ×2 IMPLANT
GLOVE BIOGEL PI INDICATOR 8 (GLOVE) ×2
GOWN STRL REUS W/ TWL LRG LVL3 (GOWN DISPOSABLE) ×2 IMPLANT
GOWN STRL REUS W/TWL LRG LVL3 (GOWN DISPOSABLE) ×2
IRRIGATOR SUCT 8 DISP DVNC XI (IRRIGATION / IRRIGATOR) ×1 IMPLANT
IRRIGATOR SUCTION 8MM XI DISP (IRRIGATION / IRRIGATOR) ×1
KIT TURNOVER KIT A (KITS) IMPLANT
MANIFOLD NEPTUNE II (INSTRUMENTS) IMPLANT
MARKER SKIN DUAL TIP RULER LAB (MISCELLANEOUS) ×1 IMPLANT
NDL HYPO 25X1 1.5 SAFETY (NEEDLE) IMPLANT
NDL SAFETY ECLIPSE 18X1.5 (NEEDLE) IMPLANT
NEEDLE HYPO 18GX1.5 SHARP (NEEDLE) ×1
NEEDLE HYPO 25X1 1.5 SAFETY (NEEDLE) ×2 IMPLANT
NS IRRIG 1000ML POUR BTL (IV SOLUTION) IMPLANT
PACK ARTHROSCOPY WL (CUSTOM PROCEDURE TRAY) ×2 IMPLANT
PADDING CAST COTTON 6X4 STRL (CAST SUPPLIES) IMPLANT
PENCIL SMOKE EVACUATOR (MISCELLANEOUS) IMPLANT
SPONGE LAP 4X18 RFD (DISPOSABLE) ×2 IMPLANT
SUT ETHILON 2 0 PS N (SUTURE) IMPLANT
SYR 10ML LL (SYRINGE) ×1 IMPLANT
SYR CONTROL 10ML LL (SYRINGE) ×1 IMPLANT
TOWEL OR 17X26 10 PK STRL BLUE (TOWEL DISPOSABLE) ×2 IMPLANT
TUBING ARTHROSCOPY IRRIG 16FT (MISCELLANEOUS) ×2 IMPLANT
WATER STERILE IRR 1000ML POUR (IV SOLUTION) ×2 IMPLANT

## 2019-05-13 NOTE — Interval H&P Note (Signed)
I participated in the care of this patient and agree with the above history, physical and evaluation. I performed a review of the history and a physical exam as detailed   Datra Clary Daniel Skeeter Sheard MD  

## 2019-05-13 NOTE — Anesthesia Preprocedure Evaluation (Signed)
Anesthesia Evaluation  Patient identified by MRN, date of birth, ID band Patient awake    Reviewed: Allergy & Precautions, NPO status , Patient's Chart, lab work & pertinent test results  History of Anesthesia Complications Negative for: history of anesthetic complications  Airway Mallampati: II  TM Distance: >3 FB Neck ROM: Full    Dental  (+) Teeth Intact   Pulmonary neg pulmonary ROS,    Pulmonary exam normal        Cardiovascular negative cardio ROS Normal cardiovascular exam     Neuro/Psych negative neurological ROS  negative psych ROS   GI/Hepatic Neg liver ROS, GERD  ,  Endo/Other  Morbid obesity  Renal/GU negative Renal ROS  negative genitourinary   Musculoskeletal  (+) Arthritis ,   Abdominal (+) + obese,   Peds  Hematology negative hematology ROS (+)   Anesthesia Other Findings   Reproductive/Obstetrics                             Anesthesia Physical Anesthesia Plan  ASA: III  Anesthesia Plan: General   Post-op Pain Management:    Induction: Intravenous  PONV Risk Score and Plan: 3 and Ondansetron, Dexamethasone, Midazolam and Treatment may vary due to age or medical condition  Airway Management Planned: LMA and Oral ETT  Additional Equipment: None  Intra-op Plan:   Post-operative Plan: Extubation in OR  Informed Consent: I have reviewed the patients History and Physical, chart, labs and discussed the procedure including the risks, benefits and alternatives for the proposed anesthesia with the patient or authorized representative who has indicated his/her understanding and acceptance.     Dental advisory given  Plan Discussed with:   Anesthesia Plan Comments:         Anesthesia Quick Evaluation

## 2019-05-13 NOTE — Op Note (Signed)
05/13/2019  1:22 PM  PATIENT:  Madison Flowers    PRE-OPERATIVE DIAGNOSIS:  LEFT KNEE LATERAL MENISCUS TEAR  POST-OPERATIVE DIAGNOSIS:  Same  PROCEDURE:  LEFT KNEE ARTHROSCOPY, CHONDROPLASTY, LATERAL MENISECTOMY  SURGEON:  Sheral Apley, MD  ASSISTANT: Aquilla Hacker, PA-C, he was present and scrubbed throughout the case, critical for completion in a timely fashion, and for retraction, instrumentation, and closure.   ANESTHESIA:   General  BLOOD LOSS: min  COMPLICATIONS: None   PREOPERATIVE INDICATIONS:  Madison Flowers is a  44 y.o. female with a diagnosis of LEFT KNEE LATERAL MENISCUS TEAR who failed conservative measures and elected for surgical management.    The risks benefits and alternatives were discussed with the patient preoperatively including but not limited to the risks of infection, bleeding, nerve injury, cardiopulmonary complications, the need for revision surgery, among others, and the patient was willing to proceed.  OPERATIVE IMPLANTS: none  OPERATIVE FINDINGS: Examination under anesthesia: stable Diagnostic Arthroscopy:  articular cartilage:Grade 3/4 MFC Medial meniscus:stable Lateral meniscus:radial tear Anterior cruciate ligament/PCL: stable Loose bodies: osteochondral piece    OPERATIVE PROCEDURE:  Patient was identified in the preoperative holding area and site was marked by me female was transported to the operating theater and placed on the table in supine position taking care to pad all bony prominences. After a preincinduction time out anesthesia was induced.  female received ancef for preoperative antibiotics. The left lower extremity was prepped and draped in normal sterile fashion and a pre-incision timeout was performed.   A small stab incision was made in the anterolateral portal position. The arthroscope was introduced in the joint. A medial portal was then established under direct visualization just above the anterior  horn of the medial meniscus. Diagnostic arthroscopy was then carried out with findings as described above.  I performed a chondroplasty of there PF joint space.   I perfomred a microfractyure of her MFC  I debrided her lateral meniscus tear.   I removed a osteochondral loose body  The arthroscopic equipment was removed from the joint and the portals were closed with 3-0 nylon in an interrupted fashion. The knee was infiltrated with depomedrol.  Sterile dressings were then applied including Xeroform 4 x 4's ABDs an ACE bandage.  The patient was then allowed to awaken from general anesthesia, transferred to the stretcher and taken to the recovery room in stable condition.  POSTOPERATIVE PLAN: The patient will be discharged home today and will followup in one week for suture removal and wound check.  VTE prophylaxis: mobilize and ASA

## 2019-05-13 NOTE — Transfer of Care (Signed)
Immediate Anesthesia Transfer of Care Note  Patient: Madison Flowers  Procedure(s) Performed: Procedure(s): LEFT KNEE ARTHROSCOPY, CHONDROPLASTY, LATERAL MENISECTOMY (Left)  Patient Location: PACU  Anesthesia Type:General  Level of Consciousness:  sedated, patient cooperative and responds to stimulation  Airway & Oxygen Therapy:Patient Spontanous Breathing and Patient connected to face mask oxgen  Post-op Assessment:  Report given to PACU RN and Post -op Vital signs reviewed and stable  Post vital signs:  Reviewed and stable  Last Vitals:  Vitals:   05/13/19 0819  BP: (!) 149/89  Pulse: 95  Resp: 18  Temp: 36.8 C  SpO2: 95%    Complications: No apparent anesthesia complications

## 2019-05-13 NOTE — Anesthesia Postprocedure Evaluation (Signed)
Anesthesia Post Note  Patient: Ginnifer Creelman  Procedure(s) Performed: LEFT KNEE ARTHROSCOPY, CHONDROPLASTY, LATERAL MENISECTOMY (Left Knee)     Patient location during evaluation: PACU Anesthesia Type: General Level of consciousness: awake and alert Pain management: pain level controlled Vital Signs Assessment: post-procedure vital signs reviewed and stable Respiratory status: spontaneous breathing, nonlabored ventilation and respiratory function stable Cardiovascular status: blood pressure returned to baseline and stable Postop Assessment: no apparent nausea or vomiting Anesthetic complications: no    Last Vitals:  Vitals:   05/13/19 1200 05/13/19 1233  BP: (!) 152/97 (!) 152/82  Pulse: 76 78  Resp: 20   Temp:    SpO2: 97% 97%    Last Pain:  Vitals:   05/13/19 1233  TempSrc:   PainSc: 3                  Lucretia Kern

## 2019-05-13 NOTE — Anesthesia Procedure Notes (Signed)
Procedure Name: LMA Insertion Date/Time: 05/13/2019 10:27 AM Performed by: Theodosia Quay, CRNA Pre-anesthesia Checklist: Patient identified, Emergency Drugs available, Suction available and Patient being monitored Patient Re-evaluated:Patient Re-evaluated prior to induction Oxygen Delivery Method: Circle System Utilized Preoxygenation: Pre-oxygenation with 100% oxygen Induction Type: IV induction Ventilation: Mask ventilation without difficulty LMA: LMA with gastric port inserted LMA Size: 4.0 Number of attempts: 1 Airway Equipment and Method: Bite block Placement Confirmation: positive ETCO2 Tube secured with: Tape Dental Injury: Teeth and Oropharynx as per pre-operative assessment

## 2019-05-13 NOTE — Discharge Instructions (Signed)
Keep leg elevated with ice to reduce pain and swelling. Stop as needed pain medication as soon as you are able.  Diet: As you were doing prior to hospitalization   Dressing:  You may remove dressings and shower over incisions 3 days after surgery.  Place clean Band-Aid over incisions.  Continue to use ace wrap for compression to reduce swelling.   Activity:  Increase activity slowly as tolerated, but follow the weight bearing instructions below.  The rules on driving is that you can not be taking narcotics while you drive, and you must feel in control of the vehicle.    Weight Bearing: As tolerated   To prevent constipation: you may use a stool softener such as -  Colace (over the counter) 100 mg by mouth twice a day  Drink plenty of fluids (prune juice may be helpful) and high fiber foods Miralax (over the counter) for constipation as needed.    Itching:  If you experience itching with your medications, try taking only a single pain pill, or even half a pain pill at a time.  You can also use benadryl over the counter for itching or also to help with sleep.   Precautions:  If you experience chest pain or shortness of breath - call 911 immediately for transfer to the hospital emergency department!!  If you develop a fever greater that 101 F, purulent drainage from wound, increased redness or drainage from wound, or calf pain -- Call the office at [phone]                                                 Follow- Up Appointment:  Please call for an appointment to be seen in 2 weeks [place] - [phone]

## 2019-05-14 ENCOUNTER — Encounter: Payer: Self-pay | Admitting: *Deleted

## 2019-06-11 ENCOUNTER — Encounter: Payer: Self-pay | Admitting: Internal Medicine

## 2019-06-11 ENCOUNTER — Other Ambulatory Visit: Payer: Self-pay

## 2019-06-11 ENCOUNTER — Ambulatory Visit (INDEPENDENT_AMBULATORY_CARE_PROVIDER_SITE_OTHER): Payer: BC Managed Care – PPO | Admitting: Internal Medicine

## 2019-06-11 ENCOUNTER — Encounter: Payer: Self-pay | Admitting: *Deleted

## 2019-06-11 VITALS — BP 114/76 | HR 78 | Ht 61.0 in | Wt 275.2 lb

## 2019-06-11 DIAGNOSIS — R0781 Pleurodynia: Secondary | ICD-10-CM

## 2019-06-11 DIAGNOSIS — M79605 Pain in left leg: Secondary | ICD-10-CM

## 2019-06-11 DIAGNOSIS — R600 Localized edema: Secondary | ICD-10-CM | POA: Diagnosis not present

## 2019-06-11 NOTE — Patient Instructions (Signed)
Medication Instructions:  Your physician recommends that you continue on your current medications as directed. Please refer to the Current Medication list given to you today.  *If you need a refill on your cardiac medications before your next appointment, please call your pharmacy*   Testing/Procedures: Left Lower Extremity DVT doppler - ASAP   Follow-Up: At [facility], you and your health needs are our priority.  As part of our continuing mission to provide you with exceptional heart care, we have created designated Provider Care Teams.  These Care Teams include your primary Cardiologist (physician) and Advanced Practice Providers (APPs -  Physician Assistants and Nurse Practitioners) who all work together to provide you with the care you need, when you need it.  We recommend signing up for the patient portal called "MyChart".  Sign up information is provided on this After Visit Summary.  MyChart is used to connect with patients for Virtual Visits (Telemedicine).  Patients are able to view lab/test results, encounter notes, upcoming appointments, etc.  Non-urgent messages can be sent to your provider as well.   To learn more about what you can do with MyChart, go to [email or web address].    Your next appointment:   as needed with Dr. Rennis Golden

## 2019-06-11 NOTE — Progress Notes (Signed)
OFFICE CONSULT NOTE  Chief Complaint:  Left leg pain  Primary Care Physician: Boykin Nearing, MD  HPI:  Madison Flowers is a 44 y.o. female who is being seen today for the evaluation of left leg pain at the request of Dr. Fredonia Highland.  Is a pleasant 44 year old Hispanic female who recently underwent left knee arthroscopy with lateral meniscectomy.  Subsequently she is reported persistent pain and swelling of the left leg.  Apparently she was referred to [place] for evaluation of possible DVT.  In January she had complaints of leg pain and was seen in the ER.  She had an outpatient venous Doppler add vein and vascular surgery which demonstrated no DVT of the lower extremities.  She denies any redness, fever or palpable cords.  There is no evidence for tachycardia or hypoxia with SPO2 99%.  She has complained of some pleuritic chest discomfort.  This seems to be fairly constant.  Her EKG shows sinus rhythm at 70 without any significant ischemic changes.  No other known medical problems other than than she is obese.  PMHx:  Past Medical History:  Diagnosis Date  . Arthritis    Rt knee  . Chronic pelvic pain in female   . GERD (gastroesophageal reflux disease)   . Plantar fasciitis     Past Surgical History:  Procedure Laterality Date  . KNEE ARTHROSCOPY WITH LATERAL MENISECTOMY Left 05/13/2019   Procedure: LEFT KNEE ARTHROSCOPY, CHONDROPLASTY, LATERAL MENISECTOMY;  Surgeon: Renette Butters, MD;  Location: [facility];  Service: Orthopedics;  Laterality: Left;    FAMHx:  Family History  Problem Relation Age of Onset  . Cancer Mother        ovarian   . Depression Mother   . Diabetes Maternal Uncle   . Heart disease Maternal Uncle   . Heart disease Maternal Grandmother     SOCHx:   reports that she has never smoked. She has never used smokeless tobacco. She reports current alcohol use. She reports that she does not use drugs.  ALLERGIES:  No Known Allergies  ROS: Pertinent  items noted in HPI and remainder of comprehensive ROS otherwise negative.  HOME MEDS: No current outpatient medications on file prior to visit.   No current facility-administered medications on file prior to visit.    LABS/IMAGING: No results found for this or any previous visit (from the past 48 hour(s)). No results found.  LIPID PANEL: No results found for: CHOL, TRIG, HDL, CHOLHDL, VLDL, LDLCALC, LDLDIRECT  WEIGHTS: Wt Readings from Last 3 Encounters:  06/11/19 275 lb 3.2 oz (124.8 kg)  05/13/19 278 lb (126.1 kg)  05/06/19 278 lb (126.1 kg)    VITALS: BP 114/76   Pulse 78   Ht 5\' 1"  (1.549 m)   Wt 275 lb 3.2 oz (124.8 kg)   SpO2 99%   BMI 52.00 kg/m   EXAM: General appearance: alert, no distress and morbidly obese Neck: no carotid bruit, no JVD and thyroid not enlarged, symmetric, no tenderness/mass/nodules Lungs: clear to auscultation bilaterally Heart: regular rate and rhythm, S1, S2 normal, no murmur, click, rub or gallop Abdomen: soft, non-tender; bowel sounds normal; no masses,  no organomegaly and obese Extremities: edema Trace bilateral pitting edema, left knee in a soft brace Pulses: 2+ and symmetric Skin: Skin color, texture, turgor normal. No rashes or lesions Neurologic: Grossly normal Psych: Pleasant  EKG: Normal sinus rhythm at 70- personally reviewed  ASSESSMENT: 1. Left leg pain and swelling 2. Pleuritic chest pain  PLAN:  1.   Madison Flowers is complaining of left leg pain and swelling which has persisted postoperatively.  She was referred for possible DVT.  On exam there is some trace bilateral lower extremity edema but no significant asymmetry.  I cannot palpate a cord.  Her left knee is in a soft brace.  I would recommend lower extremity venous Dopplers.  We had an availability to do it today however her schedule would not allow it.  We will likely be able to schedule it tomorrow.  If it is positive, then would recommend a CT coronary  angiogram to rule out possible PE however I feel that her chest pain is likely noncardiac and pretest probability for PE is low given normal oxygen saturation, normal heart rate, etc.  Thanks for the kind referral.  Chrystie Nose, MD, [facility], FACP  [place]  [facility]  Medical Director of the [facility]id [facility] &  Cardiovascular Risk Reduction Clinic Diplomate of the American Board of Clinical Lipidology Attending Cardiologist  Direct Dial: [phone]  Fax: [phone]  Website:  [email or web address]  Lisette Abu  06/11/2019, 2:21 PM

## 2019-06-11 NOTE — Telephone Encounter (Signed)
error 

## 2019-06-12 ENCOUNTER — Ambulatory Visit (HOSPITAL_COMMUNITY)
Admission: RE | Admit: 2019-06-12 | Discharge: 2019-06-12 | Disposition: A | Discharge status: Home / Self Care | Payer: BC Managed Care – PPO | Source: Ambulatory Visit | Attending: Cardiology | Admitting: Cardiology

## 2019-06-12 DIAGNOSIS — M79605 Pain in left leg: Secondary | ICD-10-CM | POA: Insufficient documentation

## 2019-06-12 DIAGNOSIS — R600 Localized edema: Secondary | ICD-10-CM | POA: Diagnosis present

## 2019-11-22 ENCOUNTER — Other Ambulatory Visit: Payer: Self-pay

## 2019-11-22 ENCOUNTER — Encounter (HOSPITAL_COMMUNITY): Payer: Self-pay | Admitting: Emergency Medicine

## 2019-11-22 ENCOUNTER — Inpatient Hospital Stay (HOSPITAL_COMMUNITY)
Admission: EM | Admit: 2019-11-22 | Discharge: 2019-11-24 | DRG: 177 | Disposition: A | Discharge status: Home / Self Care | Payer: BC Managed Care – PPO | Attending: Internal Medicine | Admitting: Internal Medicine

## 2019-11-22 ENCOUNTER — Emergency Department (HOSPITAL_COMMUNITY): Payer: BC Managed Care – PPO

## 2019-11-22 DIAGNOSIS — K219 Gastro-esophageal reflux disease without esophagitis: Secondary | ICD-10-CM | POA: Diagnosis present

## 2019-11-22 DIAGNOSIS — U071 COVID-19: Secondary | ICD-10-CM | POA: Diagnosis not present

## 2019-11-22 DIAGNOSIS — R079 Chest pain, unspecified: Secondary | ICD-10-CM | POA: Diagnosis present

## 2019-11-22 DIAGNOSIS — M722 Plantar fascial fibromatosis: Secondary | ICD-10-CM | POA: Diagnosis present

## 2019-11-22 DIAGNOSIS — G8929 Other chronic pain: Secondary | ICD-10-CM | POA: Diagnosis present

## 2019-11-22 DIAGNOSIS — J9601 Acute respiratory failure with hypoxia: Secondary | ICD-10-CM | POA: Diagnosis present

## 2019-11-22 DIAGNOSIS — M1990 Unspecified osteoarthritis, unspecified site: Secondary | ICD-10-CM | POA: Diagnosis present

## 2019-11-22 DIAGNOSIS — A0839 Other viral enteritis: Secondary | ICD-10-CM | POA: Diagnosis present

## 2019-11-22 DIAGNOSIS — J1282 Pneumonia due to coronavirus disease 2019: Secondary | ICD-10-CM

## 2019-11-22 DIAGNOSIS — E876 Hypokalemia: Secondary | ICD-10-CM | POA: Diagnosis present

## 2019-11-22 DIAGNOSIS — R102 Pelvic and perineal pain: Secondary | ICD-10-CM | POA: Diagnosis present

## 2019-11-22 DIAGNOSIS — Z6841 Body Mass Index (BMI) 40.0 and over, adult: Secondary | ICD-10-CM

## 2019-11-22 LAB — BASIC METABOLIC PANEL
Anion gap: 10 (ref 5–15)
BUN: 11 mg/dL (ref 6–20)
CO2: 25 mmol/L (ref 22–32)
Calcium: 8.7 mg/dL — ABNORMAL LOW (ref 8.9–10.3)
Chloride: 100 mmol/L (ref 98–111)
Creatinine, Ser: 0.64 mg/dL (ref 0.44–1.00)
GFR calc Af Amer: >60 mL/min (ref >60)
GFR calc non Af Amer: >60 mL/min (ref >60)
Glucose, Bld: 123 mg/dL — ABNORMAL HIGH (ref 70–99)
Potassium: 3.6 mmol/L (ref 3.5–5.1)
Sodium: 135 mmol/L (ref 135–145)

## 2019-11-22 LAB — CBC
HCT: 38.1 % (ref 36.0–46.0)
Hemoglobin: 12.2 g/dL (ref 12.0–15.0)
MCH: 30 pg (ref 26.0–34.0)
MCHC: 32 g/dL (ref 30.0–36.0)
MCV: 93.8 fL (ref 80.0–100.0)
Platelets: 273 10*3/uL (ref 150–400)
RBC: 4.06 MIL/uL (ref 3.87–5.11)
RDW: 12.3 % (ref 11.5–15.5)
WBC: 7.4 10*3/uL (ref 4.0–10.5)
nRBC: 0 % (ref 0.0–0.2)

## 2019-11-22 LAB — I-STAT BETA HCG BLOOD, ED (NOT ORDERABLE): I-stat hCG, quantitative: <5 m[IU]/mL (ref <5)

## 2019-11-22 LAB — SARS CORONAVIRUS 2 BY RT PCR (HOSPITAL ORDER, PERFORMED IN ~~LOC~~ HOSPITAL LAB): SARS Coronavirus 2: POSITIVE — AB

## 2019-11-22 LAB — TROPONIN I (HIGH SENSITIVITY): Troponin I (High Sensitivity): 13 ng/L (ref <18)

## 2019-11-22 MED ORDER — ACETAMINOPHEN 325 MG PO TABS
650.0000 mg | ORAL_TABLET | Freq: Once | ORAL | Status: AC
  Administered 2019-11-23: 650 mg via ORAL
  Filled 2019-11-22: qty 2

## 2019-11-22 MED ORDER — ACETAMINOPHEN 325 MG PO TABS
650.0000 mg | ORAL_TABLET | Freq: Once | ORAL | Status: AC | PRN
  Administered 2019-11-22: 650 mg via ORAL
  Filled 2019-11-22: qty 2

## 2019-11-22 NOTE — ED Provider Notes (Signed)
[facility]-EMERGENCY DEPT Provider Note   CSN: [id number] Arrival date & time: 11/22/19  1720     History Chief Complaint  Patient presents with  . Chest Pain  . Generalized Body Aches  . Fever  . Cough    Madison Flowers is a 44 y.o. female.  Patient presents to the emergency department with a chief complaint of generalized body aches, fever, chills, cough, and vomiting.  She states that she began feeling sick today while at work.  She had not had any vomiting until tonight.  She has not taken anything for her symptoms prior to arrival, but was given Tylenol in triage.  She is unvaccinated against Covid-19.  She denies any diarrhea.  She denies any known sick contacts.  The history is provided by the patient. No language interpreter was used.       Past Medical History:  Diagnosis Date  . Arthritis    Rt knee  . Chronic pelvic pain in female   . GERD (gastroesophageal reflux disease)   . Plantar fasciitis     Patient Active Problem List   Diagnosis Date Noted  . Plantar fasciitis of left foot 03/27/2014  . Decreased libido 03/27/2014  . Dyspareunia 12/12/2013  . Patellar tendon strain 12/12/2013  . Hemorrhoids 12/12/2013    Past Surgical History:  Procedure Laterality Date  . KNEE ARTHROSCOPY WITH LATERAL MENISECTOMY Left 05/13/2019   Procedure: LEFT KNEE ARTHROSCOPY, CHONDROPLASTY, LATERAL MENISECTOMY;  Surgeon: Sheral Apley, MD;  Location: [facility];  Service: Orthopedics;  Laterality: Left;     OB History   No obstetric history on file.     Family History  Problem Relation Age of Onset  . Cancer Mother        ovarian   . Depression Mother   . Diabetes Maternal Uncle   . Heart disease Maternal Uncle   . Heart disease Maternal Grandmother     Social History   Tobacco Use  . Smoking status: Never Smoker  . Smokeless tobacco: Never Used  Vaping Use  . Vaping Use: Never used  Substance Use Topics  . Alcohol use: Yes     Comment: occasional  . Drug use: No    Home Medications Prior to Admission medications   Not on File    Allergies    Patient has no known allergies.  Review of Systems   Review of Systems  All other systems reviewed and are negative.   Physical Exam Updated Vital Signs BP 127/77 (BP Location: Left Arm)   Pulse (!) 106   Temp (!) 101 F (38.3 C) (Oral)   Resp 19   LMP 10/29/2019   SpO2 96%   Physical Exam Vitals and nursing note reviewed.  Constitutional:      General: She is not in acute distress.    Appearance: She is well-developed. She is obese.  HENT:     Head: Normocephalic and atraumatic.  Eyes:     Conjunctiva/sclera: Conjunctivae normal.  Cardiovascular:     Rate and Rhythm: Normal rate and regular rhythm.     Heart sounds: No murmur heard.   Pulmonary:     Effort: Pulmonary effort is normal. No respiratory distress.     Breath sounds: Normal breath sounds.  Abdominal:     Palpations: Abdomen is soft.     Tenderness: There is no abdominal tenderness.  Musculoskeletal:     Cervical back: Neck supple.  Skin:    General: Skin is warm  and dry.  Neurological:     Mental Status: She is alert and oriented to person, place, and time.  Psychiatric:        Mood and Affect: Mood normal.        Behavior: Behavior normal.     ED Results / Procedures / Treatments   Labs (all labs ordered are listed, but only abnormal results are displayed) Labs Reviewed  SARS CORONAVIRUS 2 BY RT PCR (HOSPITAL ORDER, PERFORMED IN [facility]) - Abnormal; Notable for the following components:      Result Value   SARS Coronavirus 2 POSITIVE (*)    All other components within normal limits  BASIC METABOLIC PANEL - Abnormal; Notable for the following components:   Glucose, Bld 123 (*)    Calcium 8.7 (*)    All other components within normal limits  CBC  I-STAT BETA HCG BLOOD, ED ([facility], [facility], [facility] ONLY)  I-STAT BETA HCG BLOOD, ED (NOT ORDERABLE)  TROPONIN I  (HIGH SENSITIVITY)  TROPONIN I (HIGH SENSITIVITY)    EKG EKG Interpretation  Date/Time:  Saturday November 22 2019 17:55:37 EDT Ventricular Rate:  105 PR Interval:    QRS Duration: 80 QT Interval:  331 QTC Calculation: 438 R Axis:   24 Text Interpretation: Sinus tachycardia Right atrial enlargement Borderline T abnormalities, anterior leads Since last tracing rate faster Confirmed by Linwood Dibbles [phone]) on 11/22/2019 6:40:20 PM   Radiology DG Chest 2 View  Result Date: 11/22/2019 CLINICAL DATA:  44 year old with acute onset of cough, fever, chest pain and myalgias. EXAM: CHEST - 2 VIEW COMPARISON:  01/06/2016 and earlier including CTA chest 12/17/2013. FINDINGS: Cardiomediastinal silhouette unremarkable and unchanged. Lungs clear. Bronchovascular markings normal. Pulmonary vascularity normal. No visible pleural effusions. No pneumothorax. Degenerative changes involving the thoracic spine. No interval change. IMPRESSION: No acute cardiopulmonary disease. Stable examination. Electronically Signed   By: Hulan Saas M.D.   On: 11/22/2019 18:34    Procedures Procedures (including critical care time)  Medications Ordered in ED Medications  acetaminophen (TYLENOL) tablet 650 mg (has no administration in time range)  acetaminophen (TYLENOL) tablet 650 mg (650 mg Oral Given 11/22/19 1830)    ED Course  I have reviewed the triage vital signs and the nursing notes.  Pertinent labs & imaging results that were available during my care of the patient were reviewed by me and considered in my medical decision making (see chart for details).    MDM Rules/Calculators/A&P                          This patient complains of generalized body aches and fever, this involves an extensive number of treatment options, and is a complaint that carries with it a high risk of complications and morbidity.    Differential Dx Covid, flu, URI, pneumonia  Pertinent Labs I reviewed, and interpreted  labs, which included CBC notable for no significant leuk go cytosis, hemoglobin is stable, BMP shows no significant electrolyte derangement, pregnancy test negative, initial troponin is 13, will repeat.  Covid test is positive.  Imaging Interpretation I ordered imaging studies which included chest x-ray.  I independently visualized and interpreted the chest x-ray, which showed no focal opacity.   Medications I ordered medication Tylenol for fever.  Reassessments After the interventions stated above, I reevaluated the patient and patient states she became very short of breath and had significant chest tightness while she was ambulating.  RN informs me that her  O2 sat dropped to 90% and she was tachycardic to the 110s.  Consultants I discussed case with Dr. Joseph Art, who agrees to admit the patient.  Appreciate Dr. Joseph Art for bringing the patient in.  Plan Admit   Madison Flowers was evaluated in Emergency Department on 11/22/2019 for the symptoms described in the history of present illness. She was evaluated in the context of the global COVID-19 pandemic, which necessitated consideration that the patient might be at risk for infection with the SARS-CoV-2 virus that causes COVID-19. Institutional protocols and algorithms that pertain to the evaluation of patients at risk for COVID-19 are in a state of rapid change based on information released by regulatory bodies including the CDC and federal and state organizations. These policies and algorithms were followed during the patient's care in the ED.  Final Clinical Impression(s) / ED Diagnoses Final diagnoses:  None    Rx / DC Orders ED Discharge Orders    None       Roxy Horseman, PA-C 11/23/19 0053    Linwood Dibbles, MD 11/23/19 1500

## 2019-11-22 NOTE — ED Triage Notes (Signed)
Patient here from home reporting generalized body aches, cough, fever, and chest pain. Has not been vaccinated.

## 2019-11-22 NOTE — ED Notes (Signed)
Patient has a blue top in lab

## 2019-11-23 DIAGNOSIS — K219 Gastro-esophageal reflux disease without esophagitis: Secondary | ICD-10-CM | POA: Diagnosis present

## 2019-11-23 DIAGNOSIS — U071 COVID-19: Principal | ICD-10-CM

## 2019-11-23 DIAGNOSIS — Z6841 Body Mass Index (BMI) 40.0 and over, adult: Secondary | ICD-10-CM | POA: Diagnosis not present

## 2019-11-23 DIAGNOSIS — M722 Plantar fascial fibromatosis: Secondary | ICD-10-CM | POA: Diagnosis present

## 2019-11-23 DIAGNOSIS — J9601 Acute respiratory failure with hypoxia: Secondary | ICD-10-CM | POA: Diagnosis present

## 2019-11-23 DIAGNOSIS — R102 Pelvic and perineal pain: Secondary | ICD-10-CM | POA: Diagnosis present

## 2019-11-23 DIAGNOSIS — A0839 Other viral enteritis: Secondary | ICD-10-CM | POA: Diagnosis present

## 2019-11-23 DIAGNOSIS — E876 Hypokalemia: Secondary | ICD-10-CM | POA: Diagnosis present

## 2019-11-23 DIAGNOSIS — R079 Chest pain, unspecified: Secondary | ICD-10-CM | POA: Diagnosis present

## 2019-11-23 DIAGNOSIS — G8929 Other chronic pain: Secondary | ICD-10-CM | POA: Diagnosis present

## 2019-11-23 DIAGNOSIS — M1990 Unspecified osteoarthritis, unspecified site: Secondary | ICD-10-CM | POA: Diagnosis present

## 2019-11-23 HISTORY — DX: Other viral enteritis: A08.39

## 2019-11-23 HISTORY — DX: Morbid (severe) obesity due to excess calories: E66.01

## 2019-11-23 LAB — COMPREHENSIVE METABOLIC PANEL
ALT: 14 U/L (ref 0–44)
ALT: 15 U/L (ref 0–44)
AST: 17 U/L (ref 15–41)
AST: 18 U/L (ref 15–41)
Albumin: 3.5 g/dL (ref 3.5–5.0)
Albumin: 3.5 g/dL (ref 3.5–5.0)
Alkaline Phosphatase: 50 U/L (ref 38–126)
Alkaline Phosphatase: 49 U/L (ref 38–126)
Anion gap: 10 (ref 5–15)
Anion gap: 10 (ref 5–15)
BUN: 13 mg/dL (ref 6–20)
BUN: 13 mg/dL (ref 6–20)
CO2: 26 mmol/L (ref 22–32)
CO2: 26 mmol/L (ref 22–32)
Calcium: 8.7 mg/dL — ABNORMAL LOW (ref 8.9–10.3)
Calcium: 8.5 mg/dL — ABNORMAL LOW (ref 8.9–10.3)
Chloride: 101 mmol/L (ref 98–111)
Chloride: 100 mmol/L (ref 98–111)
Creatinine, Ser: 0.46 mg/dL (ref 0.44–1.00)
Creatinine, Ser: 0.56 mg/dL (ref 0.44–1.00)
GFR calc Af Amer: >60 mL/min (ref >60)
GFR calc Af Amer: >60 mL/min (ref >60)
GFR calc non Af Amer: >60 mL/min (ref >60)
GFR calc non Af Amer: >60 mL/min (ref >60)
Glucose, Bld: 103 mg/dL — ABNORMAL HIGH (ref 70–99)
Glucose, Bld: 100 mg/dL — ABNORMAL HIGH (ref 70–99)
Potassium: 3.6 mmol/L (ref 3.5–5.1)
Potassium: 3.2 mmol/L — ABNORMAL LOW (ref 3.5–5.1)
Sodium: 137 mmol/L (ref 135–145)
Sodium: 136 mmol/L (ref 135–145)
Total Bilirubin: 0.6 mg/dL (ref 0.3–1.2)
Total Bilirubin: 0.6 mg/dL (ref 0.3–1.2)
Total Protein: 8.1 g/dL (ref 6.5–8.1)
Total Protein: 8.2 g/dL — ABNORMAL HIGH (ref 6.5–8.1)

## 2019-11-23 LAB — LACTATE DEHYDROGENASE
LDH: 119 U/L (ref 98–192)
LDH: 126 U/L (ref 98–192)

## 2019-11-23 LAB — BRAIN NATRIURETIC PEPTIDE: B Natriuretic Peptide: 29.3 pg/mL (ref 0.0–100.0)

## 2019-11-23 LAB — CBC WITH DIFFERENTIAL/PLATELET
Abs Immature Granulocytes: 0.02 10*3/uL (ref 0.00–0.07)
Basophils Absolute: 0 10*3/uL (ref 0.0–0.1)
Basophils Relative: 0 %
Eosinophils Absolute: 0 10*3/uL (ref 0.0–0.5)
Eosinophils Relative: 0 %
HCT: 36.6 % (ref 36.0–46.0)
Hemoglobin: 11.6 g/dL — ABNORMAL LOW (ref 12.0–15.0)
Immature Granulocytes: 0 %
Lymphocytes Relative: 36 %
Lymphs Abs: 1.7 10*3/uL (ref 0.7–4.0)
MCH: 29.7 pg (ref 26.0–34.0)
MCHC: 31.7 g/dL (ref 30.0–36.0)
MCV: 93.8 fL (ref 80.0–100.0)
Monocytes Absolute: 0.7 10*3/uL (ref 0.1–1.0)
Monocytes Relative: 14 %
Neutro Abs: 2.4 10*3/uL (ref 1.7–7.7)
Neutrophils Relative %: 50 %
Platelets: 248 10*3/uL (ref 150–400)
RBC: 3.9 MIL/uL (ref 3.87–5.11)
RDW: 12.7 % (ref 11.5–15.5)
WBC: 4.7 10*3/uL (ref 4.0–10.5)
nRBC: 0 % (ref 0.0–0.2)

## 2019-11-23 LAB — CBC
HCT: 36.6 % (ref 36.0–46.0)
Hemoglobin: 11.6 g/dL — ABNORMAL LOW (ref 12.0–15.0)
MCH: 29.4 pg (ref 26.0–34.0)
MCHC: 31.7 g/dL (ref 30.0–36.0)
MCV: 92.7 fL (ref 80.0–100.0)
Platelets: 264 10*3/uL (ref 150–400)
RBC: 3.95 MIL/uL (ref 3.87–5.11)
RDW: 12.5 % (ref 11.5–15.5)
WBC: 5.2 10*3/uL (ref 4.0–10.5)
nRBC: 0 % (ref 0.0–0.2)

## 2019-11-23 LAB — CREATININE, SERUM
Creatinine, Ser: 0.48 mg/dL (ref 0.44–1.00)
GFR calc Af Amer: >60 mL/min (ref >60)
GFR calc non Af Amer: >60 mL/min (ref >60)

## 2019-11-23 LAB — HEPATITIS PANEL, ACUTE
HCV Ab: NONREACTIVE
Hep A IgM: NONREACTIVE
Hep B C IgM: NONREACTIVE
Hepatitis B Surface Ag: NONREACTIVE

## 2019-11-23 LAB — D-DIMER, QUANTITATIVE: D-Dimer, Quant: 1.01 ug/mL-FEU — ABNORMAL HIGH (ref 0.00–0.50)

## 2019-11-23 LAB — PHOSPHORUS: Phosphorus: 3.9 mg/dL (ref 2.5–4.6)

## 2019-11-23 LAB — FERRITIN: Ferritin: 119 ng/mL (ref 11–307)

## 2019-11-23 LAB — MAGNESIUM: Magnesium: 2.3 mg/dL (ref 1.7–2.4)

## 2019-11-23 LAB — C-REACTIVE PROTEIN: CRP: 2.4 mg/dL — ABNORMAL HIGH (ref <1.0)

## 2019-11-23 LAB — TROPONIN I (HIGH SENSITIVITY)
Troponin I (High Sensitivity): 12 ng/L (ref <18)
Troponin I (High Sensitivity): 9 ng/L (ref <18)

## 2019-11-23 LAB — HIV ANTIBODY (ROUTINE TESTING W REFLEX): HIV Screen 4th Generation wRfx: NONREACTIVE

## 2019-11-23 LAB — PROCALCITONIN: Procalcitonin: <0.1 ng/mL

## 2019-11-23 LAB — GLUCOSE, CAPILLARY: Glucose-Capillary: 103 mg/dL — ABNORMAL HIGH (ref 70–99)

## 2019-11-23 MED ORDER — HYDROCOD POLST-CPM POLST ER 10-8 MG/5ML PO SUER: 5.0000 mL | Freq: Two times a day (BID) | ORAL | Status: DC | PRN

## 2019-11-23 MED ORDER — ENOXAPARIN SODIUM 60 MG/0.6ML ~~LOC~~ SOLN
60.0000 mg | Freq: Every day | SUBCUTANEOUS | Status: DC
  Administered 2019-11-24: 60 mg via SUBCUTANEOUS
  Filled 2019-11-23: qty 0.6

## 2019-11-23 MED ORDER — ASCORBIC ACID 500 MG PO TABS
500.0000 mg | ORAL_TABLET | Freq: Every day | ORAL | Status: DC
  Administered 2019-11-23 – 2019-11-24 (×2): 500 mg via ORAL
  Filled 2019-11-23 (×2): qty 1

## 2019-11-23 MED ORDER — OXYCODONE HCL 5 MG PO TABS: 5.0000 mg | ORAL_TABLET | ORAL | Status: DC | PRN

## 2019-11-23 MED ORDER — ACETAMINOPHEN 325 MG PO TABS
650.0000 mg | ORAL_TABLET | Freq: Four times a day (QID) | ORAL | Status: DC | PRN
  Administered 2019-11-23 – 2019-11-24 (×5): 650 mg via ORAL
  Filled 2019-11-23 (×5): qty 2

## 2019-11-23 MED ORDER — ENOXAPARIN SODIUM 60 MG/0.6ML ~~LOC~~ SOLN
60.0000 mg | Freq: Every day | SUBCUTANEOUS | Status: DC
  Filled 2019-11-23: qty 0.6

## 2019-11-23 MED ORDER — SODIUM CHLORIDE 0.9% FLUSH
3.0000 mL | Freq: Two times a day (BID) | INTRAVENOUS | Status: DC
  Administered 2019-11-23 – 2019-11-24 (×3): 3 mL via INTRAVENOUS

## 2019-11-23 MED ORDER — SODIUM CHLORIDE 0.9 % IV SOLN: INTRAVENOUS | Status: DC

## 2019-11-23 MED ORDER — SODIUM CHLORIDE 0.9 % IV SOLN
200.0000 mg | Freq: Once | INTRAVENOUS | Status: AC
  Administered 2019-11-23: 200 mg via INTRAVENOUS
  Filled 2019-11-23: qty 200

## 2019-11-23 MED ORDER — IPRATROPIUM-ALBUTEROL 20-100 MCG/ACT IN AERS
1.0000 | INHALATION_SPRAY | Freq: Four times a day (QID) | RESPIRATORY_TRACT | Status: DC | PRN
  Filled 2019-11-23: qty 4

## 2019-11-23 MED ORDER — POTASSIUM CHLORIDE CRYS ER 20 MEQ PO TBCR
40.0000 meq | EXTENDED_RELEASE_TABLET | Freq: Once | ORAL | Status: AC
  Administered 2019-11-23: 40 meq via ORAL
  Filled 2019-11-23: qty 2

## 2019-11-23 MED ORDER — GUAIFENESIN-DM 100-10 MG/5ML PO SYRP
10.0000 mL | ORAL_SOLUTION | ORAL | Status: DC | PRN
  Administered 2019-11-24: 10 mL via ORAL
  Filled 2019-11-23: qty 10

## 2019-11-23 MED ORDER — ZINC SULFATE 220 (50 ZN) MG PO CAPS
220.0000 mg | ORAL_CAPSULE | Freq: Every day | ORAL | Status: DC
  Administered 2019-11-23 – 2019-11-24 (×2): 220 mg via ORAL
  Filled 2019-11-23 (×2): qty 1

## 2019-11-23 MED ORDER — ENOXAPARIN SODIUM 40 MG/0.4ML ~~LOC~~ SOLN
40.0000 mg | SUBCUTANEOUS | Status: DC
  Administered 2019-11-23: 40 mg via SUBCUTANEOUS
  Filled 2019-11-23: qty 0.4

## 2019-11-23 MED ORDER — SODIUM CHLORIDE 0.9 % IV SOLN
100.0000 mg | Freq: Every day | INTRAVENOUS | Status: DC
  Administered 2019-11-24: 100 mg via INTRAVENOUS
  Filled 2019-11-23: qty 20

## 2019-11-23 MED ORDER — ONDANSETRON HCL 4 MG PO TABS: 4.0000 mg | ORAL_TABLET | Freq: Four times a day (QID) | ORAL | Status: DC | PRN

## 2019-11-23 MED ORDER — POLYETHYLENE GLYCOL 3350 17 G PO PACK: 17.0000 g | PACK | Freq: Every day | ORAL | Status: DC | PRN

## 2019-11-23 MED ORDER — IPRATROPIUM-ALBUTEROL 20-100 MCG/ACT IN AERS
1.0000 | INHALATION_SPRAY | Freq: Four times a day (QID) | RESPIRATORY_TRACT | Status: DC
  Administered 2019-11-23: 1 via RESPIRATORY_TRACT
  Filled 2019-11-23: qty 4

## 2019-11-23 MED ORDER — ONDANSETRON HCL 4 MG/2ML IJ SOLN: 4.0000 mg | Freq: Four times a day (QID) | INTRAMUSCULAR | Status: DC | PRN

## 2019-11-23 NOTE — Progress Notes (Signed)
RT Note: Flutter Valve Therapy devices are currently on backorder.

## 2019-11-23 NOTE — H&P (Signed)
[facility] History and Physical  Madison Flowers WSF:[id number] DOB: [birth year] DOA: 11/22/2019  Referring physician:  PCP: Dessa Phi, MD   Chief Complaint: Covid gastroenteritis  HPI: Madison Flowers is a 44 y.o. Hispanic F PMHx LEFT foot plantar fasciitis, decreased libido, dyspareunia, patellar tendon strain, hemorrhoids,  Presents to the emergency department with a chief complaint of generalized body aches, fever, chills, cough, and vomiting measured T-max 39.4 C.  She states that she began feeling sick today while at work.  She had not had any vomiting until tonight.  She has not taken anything for her symptoms prior to arrival, but was given Tylenol in triage.  She is unvaccinated against Covid-19.  She denies any diarrhea.  She denies any known sick contacts.  The history is provided by the patient. No language interpreter was used.    Review of Systems:  Covid vaccination; no vaccination  Constitutional:  No weight loss, positive night sweats, Fevers, chills, fatigue.  HEENT:  No headaches, Difficulty swallowing,Tooth/dental problems,Sore throat,  No sneezing, itching, ear ache, nasal congestion, post nasal drip,  Cardio-vascular:  No chest pain, Orthopnea, PND, swelling in lower extremities, anasarca, dizziness, palpitations  GI:  No heartburn, indigestion, abdominal pain, positive nausea, vomiting, negative diarrhea, change in bowel habits, loss of appetite  Resp:  No shortness of breath at rest. positive DOE, no excess mucus, no productive cough, positive non-productive cough, No coughing up of blood.No change in color of mucus.No wheezing.No chest wall deformity  Skin:  no rash or lesions.  GU:  no dysuria, change in color of urine, no urgency or frequency. No flank pain.  Musculoskeletal:  No joint pain or swelling. No decreased range of motion. No back pain.  Psych:  No change in mood or affect. No depression or anxiety. No memory  loss.   Past Medical History:  Diagnosis Date  . Arthritis    Rt knee  . Chronic pelvic pain in female   . GERD (gastroesophageal reflux disease)   . Plantar fasciitis    Past Surgical History:  Procedure Laterality Date  . KNEE ARTHROSCOPY WITH LATERAL MENISECTOMY Left 05/13/2019   Procedure: LEFT KNEE ARTHROSCOPY, CHONDROPLASTY, LATERAL MENISECTOMY;  Surgeon: Sheral Apley, MD;  Location: [facility];  Service: Orthopedics;  Laterality: Left;   Social History:  reports that she has never smoked. She has never used smokeless tobacco. She reports current alcohol use. She reports that she does not use drugs.  No Known Allergies  Family History  Problem Relation Age of Onset  . Cancer Mother        ovarian   . Depression Mother   . Diabetes Maternal Uncle   . Heart disease Maternal Uncle   . Heart disease Maternal Grandmother     Prior to Admission medications   Medication Sig Start Date End Date Taking? Authorizing Provider  ibuprofen (ADVIL) 100 MG tablet Take 400 mg by mouth every 6 (six) hours as needed for fever.   Yes [provider]     Consultants:    Procedures/Significant Events:  9/12 DG chest 2 view;No acute cardiopulmonary disease.    .  I have personally reviewed and interpreted all radiology studies and my findings are as above.   VENTILATOR SETTINGS: Room air 9/11   Cultures 9/11 SARS coronavirus positive 9/12 blood pending 9/12 acute hepatitis panel pending   Antimicrobials: Anti-infectives (From admission, onward)   Start     Ordered Stop   11/24/19 1000  remdesivir 100 mg in  sodium chloride 0.9 % 100 mL IVPB       "Followed by" Linked Group Details   11/23/19 0111 11/28/19 0959   11/23/19 0200  remdesivir 200 mg in sodium chloride 0.9% 250 mL IVPB       "Followed by" Linked Group Details   11/23/19 0111         Devices    LINES / TUBES:      Continuous Infusions:  Physical Exam: Vitals:   11/23/19 0019 11/23/19  0024 11/23/19 0030 11/23/19 0130  BP: (!) 133/100  140/80 139/75  Pulse: 91  87 92  Resp: (!) 22  18 (!) 22  Temp:  99.1 F (37.3 C)    TempSrc:      SpO2: 97%  97% 96%    Wt Readings from Last 3 Encounters:  06/11/19 124.8 kg  05/13/19 126.1 kg  05/06/19 126.1 kg    General: A/O x4, No acute respiratory distress, positive dry cough Eyes: negative scleral hemorrhage, negative anisocoria, negative icterus ENT: Negative Runny nose, negative gingival bleeding, Neck:  Negative scars, masses, torticollis, lymphadenopathy, JVD Lungs: decreased breath sounds bilaterally (however difficult to auscultate secondary to body habitus) without wheezes or crackles Cardiovascular: Regular rate and rhythm without murmur gallop or rub normal S1 and S2 Abdomen: MORBIDLY OBESE, negative abdominal pain, nondistended, positive soft, bowel sounds, no rebound, no ascites, no appreciable mass Extremities: No significant cyanosis, clubbing, or edema bilateral lower extremities Skin: Negative rashes, lesions, ulcers Psychiatric:  Negative depression, negative anxiety, negative fatigue, negative mania  Central nervous system:  Cranial nerves II through XII intact, tongue/uvula midline, all extremities muscle strength 5/5, sensation intact throughout, negative dysarthria, negative expressive aphasia, negative receptive aphasia.          Labs on Admission:  Basic Metabolic Panel: Recent Labs  Lab 11/22/19 1851  NA 135  K 3.6  CL 100  CO2 25  GLUCOSE 123*  BUN 11  CREATININE 0.64  CALCIUM 8.7*   Liver Function Tests: No results for input(s): AST, ALT, ALKPHOS, BILITOT, PROT, ALBUMIN in the last 168 hours. No results for input(s): LIPASE, AMYLASE in the last 168 hours. No results for input(s): AMMONIA in the last 168 hours. CBC: Recent Labs  Lab 11/22/19 1851  WBC 7.4  HGB 12.2  HCT 38.1  MCV 93.8  PLT 273   Cardiac Enzymes: No results for input(s): CKTOTAL, CKMB, CKMBINDEX, TROPONINI in  the last 168 hours.  BNP (last 3 results) No results for input(s): BNP in the last 8760 hours.  ProBNP (last 3 results) No results for input(s): PROBNP in the last 8760 hours.  CBG: No results for input(s): GLUCAP in the last 168 hours.  Radiological Exams on Admission: DG Chest 2 View  Result Date: 11/22/2019 CLINICAL DATA:  44 year old with acute onset of cough, fever, chest pain and myalgias. EXAM: CHEST - 2 VIEW COMPARISON:  01/06/2016 and earlier including CTA chest 12/17/2013. FINDINGS: Cardiomediastinal silhouette unremarkable and unchanged. Lungs clear. Bronchovascular markings normal. Pulmonary vascularity normal. No visible pleural effusions. No pneumothorax. Degenerative changes involving the thoracic spine. No interval change. IMPRESSION: No acute cardiopulmonary disease. Stable examination. Electronically Signed   By: Hulan Saas M.D.   On: 11/22/2019 18:34    EKG: Independently reviewed.  Other than patient now sinus tachycardia no significant change when compared to EKG from 06/11/2019  Assessment/Plan Active Problems:   Gastroenteritis due to COVID-19 virus   Morbidly obese (HCC)  Covid Gastroenteritis COVID-19 Labs  No results for  input(s): DDIMER, FERRITIN, LDH, CRP in the last 72 hours.  Lab Results  Component Value Date   SARSCOV2NAA POSITIVE (A) 11/22/2019   SARSCOV2NAA NEGATIVE 05/09/2019  -Inflammatory markers unavailable secondary to lab currently being offline.  Blood is being sent to [facility] for analysis. -Remdesivir per pharmacy protocol -Vitamin C and zinc per Covid protocol -Flutter valve -Incentive spirometry -Respimat QID -Clear liquid diet -Currently patient does not meet guidelines for high-dose steroids.  Will await inflammatory markers or if patient respiratory status began to deteriorate.  Would start on Solu-Medrol. -If patient's inflammatory markers are not significantly elevated and her respiratory status remains stable may be  able to discharge her to follow-up at infusion clinic to finish remdesivir dosing.  Morbidly obese -Would benefit from outpatient diet modification. -Patient most likely meets criteria for bariatric work-up.   Code Status: Full (DVT Prophylaxis: Lovenox Family Communication:   Status is: Inpatient    Dispo: The patient is from: Home              Anticipated d/c is to: Home              Anticipated d/c date is: 24 to 48 hours              Patient currently unstable     Data Reviewed: Care during the described time interval was provided by me .  I have reviewed this patient's available data, including medical history, events of note, physical examination, and all test results as part of my evaluation.   The patient is critically ill with multiple organ systems failure and requires high complexity decision making for assessment and support, frequent evaluation and titration of therapies, application of advanced monitoring technologies and extensive interpretation of multiple databases. Critical Care Time devoted to patient care services described in this note  Time spent: 70 minutes   Mehmet Scally, Roselind Messier [facility] Pager [phone]

## 2019-11-23 NOTE — ED Notes (Signed)
Assisted pt to bedside toilet.

## 2019-11-23 NOTE — ED Notes (Signed)
Report given to Heather, RN

## 2019-11-23 NOTE — ED Notes (Signed)
Pt oxygen saturation decreased to 91% and heart rate increased to 108 while ambulating in room. Pt expresses increase shob and chest tightness when ambulating.

## 2019-11-23 NOTE — Progress Notes (Signed)
Madison Flowers  EZM:[id number] DOB: [birth year] DOA: 11/22/2019 PCP: Dessa Phi, MD    Brief Narrative:  44 year old with no significant past medical history who presented to the ED with complaints of generalized body aches, fevers, cough, and vomiting but no diarrhea.  Her symptoms had begun earlier the same day of her presentation while at work.  She is not vaccinated against Covid.  In the ED a Covid PCR test was positive.  CXR revealed no evidence of a pulmonary infiltrate.  She was however observed to drop her saturations to 90% on room air when ambulating with accompanying tachycardia to 110.  Significant Events:  9/12 admit via [facility] ED   Date of Positive COVID Test:  9/11   Vaccination Status: NOT vaccinated against CoViD  COVID-19 specific Treatment: Remdesivir 9/11 >  Antimicrobials:  none  DVT prophylaxis: lovenox   Subjective: Saturations appear to be improved since admission.   Assessment & Plan:  Possible CoViD pneumonia - acute hypoxic resp failure  No infiltrate noted on CXR, but pt hypoxic w/ exertion - cont remdesivir - high risk for progression   Recent Labs  Lab 11/23/19 0230 11/23/19 0500  DDIMER  --  1.01*  FERRITIN 119  --   CRP 2.4*  --   ALT 14 15  PROCALCITON <0.10  --      CoViD gastroenteritis   Hypokalemia  Morbid obesity - BMI 52 06/11/19   Code Status: FULL CODE Family Communication:    Consultants:  none  Objective: Blood pressure (!) 158/96, pulse 81, temperature 99.1 F (37.3 C), resp. rate 18, last menstrual period 10/29/2019, SpO2 96 %. No intake or output data in the 24 hours ending 11/23/19 0725 There were no vitals filed for this visit.  Examination: Pt seen in f/u   CBC: Recent Labs  Lab 11/22/19 1851 11/23/19 0230 11/23/19 0500  WBC 7.4 5.2 4.7  NEUTROABS  --   --  2.4  HGB 12.2 11.6* 11.6*  HCT 38.1 36.6 36.6  MCV 93.8 92.7 93.8  PLT 273 264 248   Basic Metabolic Panel: Recent Labs    Lab 11/22/19 1851 11/23/19 0230 11/23/19 0500  NA 135 137 136  K 3.6 3.6 3.2*  CL 100 101 100  CO2 25 26 26   GLUCOSE 123* 103* 100*  BUN 11 13 13   CREATININE 0.64 0.46  0.48 0.56  CALCIUM 8.7* 8.7* 8.5*  MG  --  2.3  --   PHOS  --  3.9  --    GFR: CrCl cannot be calculated (Unknown ideal weight.).  Liver Function Tests: Recent Labs  Lab 11/23/19 0230 11/23/19 0500  AST 17 18  ALT 14 15  ALKPHOS 50 49  BILITOT 0.6 0.6  PROT 8.1 8.2*  ALBUMIN 3.5 3.5    HbA1C: Hemoglobin A1C  Date/Time Value Ref Range Status  03/30/2015 10:45 AM 4.80  Final     Recent Results (from the past 240 hour(s))  SARS Coronavirus 2 by RT PCR (hospital order, performed in [facility] hospital lab) Nasopharyngeal Nasopharyngeal Swab     Status: Abnormal   Collection Time: 11/22/19  6:33 PM   Specimen: Nasopharyngeal Swab  Result Value Ref Range Status   SARS Coronavirus 2 POSITIVE (A) NEGATIVE Final    Comment: RESULT CALLED TO, READ BACK BY AND VERIFIED WITH: I,HODGES AT 2129 ON 11/22/19 BY A,MOHAMED (NOTE) SARS-CoV-2 target nucleic acids are DETECTED  SARS-CoV-2 RNA is generally detectable in upper respiratory specimens  during the acute  phase of infection.  Positive results are indicative  of the presence of the identified virus, but do not rule out bacterial infection or co-infection with other pathogens not detected by the test.  Clinical correlation with patient history and  other diagnostic information is necessary to determine patient infection status.  The expected result is negative.  Fact Sheet for Patients:   BoilerBrush.com.cy   Fact Sheet for Healthcare Providers:   https://pope.com/    This test is not yet approved or cleared by the [place] FDA and  has been authorized for detection and/or diagnosis of SARS-CoV-2 by FDA under an Emergency Use Authorization (EUA).  This EUA will remain in effect (meaning t his  test can be used) for the duration of  the COVID-19 declaration under Section 564(b)(1) of the Act, 21 U.S.C. section 360-bbb-3(b)(1), unless the authorization is terminated or revoked sooner.  Performed at [facility], [address], [place], [place] [address]      Scheduled Meds: . vitamin C  500 mg Oral Daily  . enoxaparin (LOVENOX) injection  40 mg Subcutaneous Q24H  . Ipratropium-Albuterol  1 puff Inhalation Q6H  . sodium chloride flush  3 mL Intravenous Q12H  . zinc sulfate  220 mg Oral Daily   Continuous Infusions: . sodium chloride 75 mL/hr at 11/23/19 0238  . [START ON 11/24/2019] remdesivir 100 mg in NS 100 mL       LOS: 0 days   Lonia Blood, MD [facility] Office  [phone] Pager - Text Page per Amion  If 7PM-7AM, please contact night-coverage per Amion 11/23/2019, 7:25 AM

## 2019-11-24 ENCOUNTER — Inpatient Hospital Stay (HOSPITAL_COMMUNITY): Payer: BC Managed Care – PPO

## 2019-11-24 DIAGNOSIS — A0839 Other viral enteritis: Secondary | ICD-10-CM

## 2019-11-24 LAB — CBC WITH DIFFERENTIAL/PLATELET
Abs Immature Granulocytes: 0.02 10*3/uL (ref 0.00–0.07)
Basophils Absolute: 0 10*3/uL (ref 0.0–0.1)
Basophils Relative: 1 %
Eosinophils Absolute: 0 10*3/uL (ref 0.0–0.5)
Eosinophils Relative: 1 %
HCT: 39.8 % (ref 36.0–46.0)
Hemoglobin: 12.4 g/dL (ref 12.0–15.0)
Immature Granulocytes: 1 %
Lymphocytes Relative: 39 %
Lymphs Abs: 1.7 10*3/uL (ref 0.7–4.0)
MCH: 29.7 pg (ref 26.0–34.0)
MCHC: 31.2 g/dL (ref 30.0–36.0)
MCV: 95.2 fL (ref 80.0–100.0)
Monocytes Absolute: 0.5 10*3/uL (ref 0.1–1.0)
Monocytes Relative: 12 %
Neutro Abs: 2 10*3/uL (ref 1.7–7.7)
Neutrophils Relative %: 46 %
Platelets: 240 10*3/uL (ref 150–400)
RBC: 4.18 MIL/uL (ref 3.87–5.11)
RDW: 12.3 % (ref 11.5–15.5)
WBC: 4.3 10*3/uL (ref 4.0–10.5)
nRBC: 0 % (ref 0.0–0.2)

## 2019-11-24 LAB — COMPREHENSIVE METABOLIC PANEL
ALT: 15 U/L (ref 0–44)
AST: 21 U/L (ref 15–41)
Albumin: 3.3 g/dL — ABNORMAL LOW (ref 3.5–5.0)
Alkaline Phosphatase: 45 U/L (ref 38–126)
Anion gap: 9 (ref 5–15)
BUN: 9 mg/dL (ref 6–20)
CO2: 24 mmol/L (ref 22–32)
Calcium: 8.5 mg/dL — ABNORMAL LOW (ref 8.9–10.3)
Chloride: 102 mmol/L (ref 98–111)
Creatinine, Ser: 0.47 mg/dL (ref 0.44–1.00)
GFR calc Af Amer: >60 mL/min (ref >60)
GFR calc non Af Amer: >60 mL/min (ref >60)
Glucose, Bld: 96 mg/dL (ref 70–99)
Potassium: 4 mmol/L (ref 3.5–5.1)
Sodium: 135 mmol/L (ref 135–145)
Total Bilirubin: 0.5 mg/dL (ref 0.3–1.2)
Total Protein: 7.6 g/dL (ref 6.5–8.1)

## 2019-11-24 LAB — D-DIMER, QUANTITATIVE: D-Dimer, Quant: 0.68 ug/mL-FEU — ABNORMAL HIGH (ref 0.00–0.50)

## 2019-11-24 LAB — FERRITIN: Ferritin: 211 ng/mL (ref 11–307)

## 2019-11-24 LAB — MAGNESIUM: Magnesium: 2 mg/dL (ref 1.7–2.4)

## 2019-11-24 MED ORDER — ASCORBIC ACID 500 MG PO TABS
500.0000 mg | ORAL_TABLET | Freq: Every day | ORAL | 0 refills | Status: DC
Start: 2019-11-25 — End: 2020-10-21

## 2019-11-24 MED ORDER — ZINC SULFATE 220 (50 ZN) MG PO CAPS
220.0000 mg | ORAL_CAPSULE | Freq: Every day | ORAL | 0 refills | Status: DC
Start: 2019-11-25 — End: 2020-10-21

## 2019-11-24 MED ORDER — ACETAMINOPHEN 325 MG PO TABS: 650.0000 mg | ORAL_TABLET | Freq: Four times a day (QID) | ORAL | Status: AC | PRN

## 2019-11-24 MED ORDER — GUAIFENESIN-DM 100-10 MG/5ML PO SYRP: 10.0000 mL | ORAL_SOLUTION | ORAL | 0 refills | Status: DC | PRN

## 2019-11-24 MED ORDER — IPRATROPIUM-ALBUTEROL 20-100 MCG/ACT IN AERS: 1.0000 | INHALATION_SPRAY | Freq: Four times a day (QID) | RESPIRATORY_TRACT | 0 refills | Status: AC | PRN

## 2019-11-24 NOTE — Progress Notes (Signed)
Patient scheduled for outpatient Remdesivir infusions 1pm on Tuesday 9/14, Wednesday 9/15, and Thursday 9/16 at [facility]. Please inform the patient to park at [address] [place], [place], as staff will be escorting the patient through the east entrance of the hospital. Appointments will take approx. 45 minutes.    There is a wave flag banner located near the entrance on [address]. Turn into this entrance and immediately turn left and park in 1 of the 5 designated Covid Infusion Parking spots. There is a phone number on the sign, please call and let the staff know what spot you are in and we will come out and get you. For questions call [phone].  Thanks.

## 2019-11-24 NOTE — Discharge Instructions (Signed)
Patient scheduled for outpatient Remdesivir infusions 1pm on Tuesday 9/14, Wednesday 9/15, and Thursday 9/16 at [facility]. Please inform the patient to park at [address], [place], as staff will be escorting the patient through the east entrance of the hospital. Appointments will take approx. 45 minutes.    There is a wave flag banner located near the entrance on [address] Turn into this entrance and immediately turn left and park in 1 of the 5 designated Covid Infusion Parking spots. There is a phone number on the sign, please call and let the staff know what spot you are in and we will come out and get you. For questions call [phone].  Thanks.  Date of Positive COVID Test: 11/22/19  Date Quarantine Ends: 12/13/19    COVID-19 COVID-19 El COVID-19 es una infeccin respiratoria causada por un virus llamado coronavirus tipo 2 causante del sndrome respiratorio agudo grave (SARS-CoV-2). La enfermedad tambin se conoce como enfermedad por coronavirus o nuevo coronavirus. En algunas personas, el virus puede no ocasionar sntomas. En otras, puede producir una infeccin grave. La infeccin puede empeorar rpidamente y causar complicaciones, como:  Neumona o infeccin en los pulmones.  Sndrome de dificultad respiratoria aguda o SDRA. Es una afeccin que se caracteriza por la acumulacin de lquido en los pulmones, que impide que los pulmones se llenen de aire y pasen oxgeno a Risk managerla sangre.  Insuficiencia respiratoria aguda. Es una afeccin que se caracteriza porque no pasa suficiente oxgeno de los pulmones al cuerpo o porque el dixido de carbono no pasa de los pulmones hacia afuera del cuerpo.  Sepsis o choque sptico. Se trata de una reaccin grave del cuerpo ante una infeccin.  Problemas de coagulacin.  Infecciones secundarias debido a bacterias u hongos.  Falla de rganos. Ocurre cuando los rganos del cuerpo dejan de funcionar. El virus que causa el COVID-19 es  contagioso. Esto significa que puede transmitirse de [place] persona a otra a travs de las gotitas de saliva de la tos y de los estornudos (secreciones respiratorias). Cules son las causas? Esta enfermedad es causada por un virus. Usted puede contagiarse con este virus:  Al inspirar las gotitas de una persona infectada. Las BJ'sgotitas pueden diseminarse cuando una persona respira, habla, canta, tose o estornuda.  Al tocar algo, como una mesa o el picaportes de [place]una puerta, que estuvo expuesto al virus (contaminado) y luego tocarse la boca, nariz o los ojos. Qu incrementa el riesgo? Riesgo de infeccin Es ms probable que se infecte con este virus si:  Se encuentra a Social workeruna distancia menor a 6 pies (2 metros) de Medical laboratory scientific officeruna persona con COVID-19.  Cuida o vive con una persona infectada con COVID-19.  Pasa tiempo en espacios interiores repletos de gente o vive en viviendas compartidas. Riesgo de enfermedad grave Es ms probable que se enferme gravemente por el virus si:  Tiene 51aos o ms. Cuanto mayor sea su edad, mayor ser el riesgo de tener una enfermedad grave.  Vive en un hogar de ancianos o centro de atencin a Air cabin crewlargo plazo.  Tiene cncer.  Tiene una enfermedad prolongada (crnica), como las siguientes: ? Enfermedad pulmonar crnica, que incluye la enfermedad pulmonar obstructiva crnica o asma. ? Una enfermedad crnica que disminuye la capacidad del cuerpo para combatir las infecciones (immunocomprometido). ? Enfermedad cardaca, que incluye insuficiencia cardaca, una afeccin que se caracteriza porque las arterias que llegan al corazn se Engineer, technical salesestrechan u obstruyen (arteriopata coronaria) o una enfermedad que provoca que el msculo cardaco se  engrose, se debilite o endurezca (miocardiopata). ? Diabetes. ? Enfermedad renal crnica. ? Anemia drepanoctica, una enfermedad que se caracteriza porque los glbulos rojos tienen una forma anormal de "hoz". ? Enfermedad heptica.  Es obeso. Cules  son los signos o sntomas? Los sntomas de esta afeccin pueden ser de leves a graves. Los sntomas pueden aparecer en el trmino de [address] a [address] despus de haber estado expuesto al virus. Incluyen los siguientes:  Fiebre o escalofros.  Tos.  Dificultad para respirar.  Dolores de [place], dolores en el cuerpo o dolores musculares.  Secrecin o congestin nasal.  Dolor de garganta.  Nueva prdida del sentido del gusto o del olfato. Algunas personas tambin pueden Mattel, como nuseas, vmitos o diarrea. Es posible que otras personas no tengan sntomas de COVID-19. Cmo se diagnostica? Esta afeccin se puede diagnosticar en funcin de lo siguiente:  Sus signos y sntomas, especialmente si: ? Vive en una zona donde hay un brote de COVID-19. ? Viaj recientemente a una zona donde el virus es frecuente. ? Cuida o vive con Neomia Dear persona a quien se le diagnostic COVID-19. ? Odelia Gage expuesto a una persona a la que se le diagnostic COVID-19.  Un examen fsico.  Anlisis de laboratorio que pueden incluir: ? Tomar una muestra de lquido de la parte posterior de la nariz y la garganta (lquido nasofarngeo), la nariz o la garganta, con un hisopo. ? Una muestra de mucosidad de los pulmones (esputo). ? Anlisis de [place].  Los estudios de diagnstico por imgenes pueden incluir radiografas, exploracin por tomografa computarizada (TC) o ecografa. Cmo se trata? En este momento, no hay ningn medicamento para tratar el COVID-19. Los medicamentos para tratar otras enfermedades se usan a modo de ensayo para comprobar si son eficaces contra el COVID-19. El mdico le informar sobre las maneras de tratar los sntomas. En la [employer], la infeccin es leve y puede controlarse en el hogar con reposo, lquidos y medicamentos de [place]. El tratamiento para una infeccin grave suele realizarse en la unidad de cuidados intensivos (UCI) de un hospital. Puede  incluir uno o ms de los siguientes. Estos tratamientos se administran hasta que los sntomas mejoran.  Recibir lquidos y [employer] a travs de una va intravenosa.  Oxgeno complementario. Para administrar oxgeno extra, se [place] un tubo en la Darene Lamer, una mascarilla o una campana de oxgeno.  Colocarlo para que se recueste boca abajo (decbito prono). Esto facilita el ingreso de oxgeno a los pulmones.  Uso continuo de [place] de presin positiva de las vas areas (CPAP) o de presin positiva de las vas areas de dos niveles (BPAP). Este tratamiento utiliza una presin de aire leve para Pharmacologist las vas respiratorias abiertas. Un tubo conectado a un motor administra oxgeno al cuerpo.  Respirador. Este tratamiento mueve el aire dentro y fuera de los pulmones mediante el uso de un tubo que se coloca en la trquea.  Traqueostoma. En este procedimiento se hace un orificio en el cuello para insertar un tubo de respiracin.  Oxigenacin por membrana extracorprea (OMEC). En este procedimiento, los pulmones tienen la posibilidad de recuperarse al asumir las funciones del corazn y los pulmones. Suministra oxgeno al cuerpo y elimina el dixido de carbono. Siga estas instrucciones en su casa: Estilo de vida  Si est enfermo, qudese en su casa, excepto para obtener atencin mdica. El mdico le indicar cunto tiempo debe quedarse en casa. Llame al mdico antes de buscar atencin mdica.  Haga reposo  en su casa como se lo haya indicado el mdico.  No consuma ningn producto que contenga nicotina o tabaco, como cigarrillos, cigarrillos electrnicos y tabaco de Theatre manager. Si necesita ayuda para dejar de fumar, consulte al mdico.  Retome sus actividades normales segn lo indicado por el mdico. Pregntele al mdico qu actividades son seguras para usted. Instrucciones generales  Use los medicamentos de venta libre y los recetados solamente como se lo haya indicado el mdico.  Beba  suficiente lquido como para Pharmacologist la orina de color amarillo plido.  Concurra a todas las visitas de [address] se lo haya indicado el mdico. Esto es importante. Cmo se evita?  No hay ninguna vacuna que ayude a prevenir la infeccin por COVID-19. Sin embargo, hay medidas que puede tomar para protegerse y Conservator, museum/gallery a Economist de este virus. Para protegerse:   No viaje a zonas donde el COVID-19 sea un riesgo. Las zonas donde se informa la presencia del COVID-19 cambian con frecuencia. Para identificar las zonas de alto riesgo y las restricciones de viaje, consulte el sitio web de viajes de Building control surveyor for [employer] Technology and Prevention Insurance claims handler) (Centros para el Control y la Prevencin de Event organiser): [email or web address].si  Si vive o debe viajar a una zona donde el COVID-19 es un riesgo, tome precauciones para evitar infecciones. ? Aljese de [employer]. ? Lvese las manos frecuentemente con agua y [place]. Use desinfectante para manos con alcohol si no dispone de [place] y [place]. ? Evite tocarse la boca, la cara, los ojos o la [place]. ? Evite salir de su casa, siga las indicaciones de su estado y de las autoridades sanitarias locales. ? Si debe salir de su casa, use un barbijo de tela o una mascarilla facial. Asegrese de que le cubra la nariz y la boca. ? Evite los espacios interiores repletos de gente. Mantenga una distancia de al menos 6 pies (2 metros) de Economist. ? Desinfecte los objetos y las superficies que se tocan con frecuencia todos [place]. Pueden incluir:  Encimeras y [place].  Picaportes e interruptores de luz.  Lavabos, fregaderos y grifos.  Aparatos electrnicos tales como telfonos, controles remotos, teclados, computadoras y tabletas. Cmo proteger a los dems: Si tiene sntomas de COVID-19, tome medidas para evitar que el virus se propague a Economist.  Si cree que tiene una infeccin por COVID-19, comunquese  de inmediato con su mdico. Informe al equipo de atencin mdica que cree que puede tener una infeccin por el COVID-19.  Qudese en su casa. Salga de su casa solo para buscar atencin mdica. No utilice el transporte pblico.  No viaje mientras est enfermo.  Lvese las manos frecuentemente con agua y [place]. Usar desinfectante para manos con alcohol si no dispone de [place] y [place].  Mantngase alejado de quienes vivan con usted. Permita que los miembros de la familia sanos cuiden a los nios y las [place], si es posible. Si tiene que cuidar a los nios o las mascotas, lvese las manos con frecuencia y use un barbijo. Si es posible, permanezca en su habitacin, separado de los dems. Utilice un bao diferente.  Asegrese de que todas las personas que viven en su casa se laven bien las manos y con frecuencia.  Tosa o estornude en un pauelo de papel o sobre su manga o codo. No tosa o estornude al aire ni se cubra la boca o la nariz con la [place].  Use un barbijo de tela o Neomia Dear  mascarilla facial. Asegrese de que le cubra la nariz y la boca. Dnde buscar ms informacin  Centers for Disease Control and Prevention (Centros para el Control y la Prevencin de Event organiser): StickerEmporium.tn  World Health Organization (Organizacin Mundial de la Salud): [email or web address]-craig.[email or web address]/ Comunquese con un mdico si:  Vive o ha viajado a una zona donde el COVID-19 es un riesgo y tiene sntomas de infeccin.  Ha tenido contacto con alguien que tiene COVID-19 y usted tiene sntomas de infeccin. Solicite ayuda inmediatamente si:  Tiene dificultad para respirar.  Siente dolor u opresin en el pecho.  Experimenta confusin.  Tiene las uas de los dedos y los labios de color [place].  Tiene dificultad para despertarse.  Los sntomas empeoran. Estos sntomas pueden representar un problema grave que constituye Radio broadcast assistant. No espere a ver si  los sntomas desaparecen. Solicite atencin mdica de inmediato. Comunquese con el servicio de emergencias de su localidad (911 en los [place]). No conduzca por sus propios medios [employer] hospital. Informe al personal mdico de emergencias si cree que tiene COVID-19. Resumen  El COVID-19 es una infeccin respiratoria causada por un virus. Tambin se conoce como enfermedad por coronavirus o nuevo coronavirus. Puede causar infecciones graves, como neumona, sndrome de dificultad respiratoria aguda, insuficiencia respiratoria aguda o sepsis.  El virus que causa el COVID-19 es contagioso. Esto significa que puede transmitirse de [place] persona a otra a travs de las gotitas que se despiden al respirar, Heritage manager, cantar, toser y Engineering geologist.  Es ms probable que desarrolle una enfermedad grave si tiene 50 aos o ms, tiene el sistema inmunitario dbil, vive en un hogar de ancianos o tiene una enfermedad crnica.  No hay ningn medicamento para tratar el COVID-19. El mdico le informar sobre las maneras de tratar los sntomas.  Tome medidas para protegerse y Conservator, museum/gallery a los Merchandiser, retail las infecciones. Lvese las manos con frecuencia y desinfecte los objetos y las superficies que se tocan con frecuencia todos [place]. Mantngase alejado de las personas que estn enfermas y use un barbijo si est enfermo. Esta informacin no tiene Theme park manager el consejo del mdico. Asegrese de hacerle al mdico cualquier pregunta que tenga. Document Revised: 01/02/2019 Document Reviewed: 04/27/2018 Elsevier Patient Education  2020 Elsevier Inc.   COVID-19: Cmo protegerse y proteger a los dems COVID-19: How to Protect Yourself and Others Sepa cmo se propaga  Actualmente, no existe ninguna vacuna para prevenir la enfermedad por coronavirus 2019 (COVID-19).  La mejor forma de prevenir la enfermedad es evitar exponerse a este virus.  Se cree que el virus se transmite principalmente de [place] persona a  [place]. ? Air Products and Chemicals que estn en contacto directo entre s (a una distancia inferior a 6 pies [1.30m]). ? A travs de las gotitas respiratorias producidas cuando una persona infectada tose, estornuda o habla. ? Estas gotitas pueden caer en la boca o en la nariz de las personas que estn cerca o pueden ser [employer] pulmones. ? El COVID-19 puede ser transmitido por personas que no presentan sntomas. Lo que todos deben hacer [employer] Service Enterprise Group las manos con frecuencia  Lvese las manos con frecuencia con agua y jabn durante al menos 20 segundos, especialmente despus de Oceanographer en un lugar pblico o despus de sonarse la nariz, toser o estornudar.  Si no dispone de [place] y [place], use un desinfectante de manos que contenga al menos un 60% de alcohol. Cubra todas las superficies de las manos y frtelas hasta que se sientan  secas.  No se toque los ojos, la nariz y la boca sin antes lavarse las manos. Evite el contacto cercano  Limite el contacto directo con otras personas tanto como sea posible.  Evite el contacto cercano con personas que estn enfermas.  Establezca distancia entre usted y [employer]. ? Recuerde que Mic[employer] no tienen sntomas pueden transmitir el virus. ? Esto es Software engineer para las personas que tienen ms riesgo de [email or web address] Cbrase la boca y la nariz con una mascarilla cuando est cerca de otras personas  Puede transmitir el COVID-19 a otras personas aunque no se sienta enfermo.  Todas las Statistician en lugares pblicos y cuando estn con otras que no vivan en su casa, especialmente cuando el distanciamiento social sea difcil de [place]. ? Las mascarillas no deben colocarse a nios menores de 2 aos de [place], a las personas que tienen problemas respiratorios o que estn inconscientes, incapacitadas o que por algn motivo no  puedan quitarse la mascarilla sin [place].  El propsito de Nurse, adult es proteger a Economist en caso de que usted est infectado.  NO utilice las [employer] a los trabajadores de [employer].  Contine manteniendo una distancia aproximada de 6 pies (1.33m) entre usted y [employer]. La mascarilla no reemplaza el distanciamiento social. Cbrase al toser y estornudar  Al toser o al estornudar, siempre cbrase la boca y la nariz con un pauelo descartable o use el interior del codo.  Deseche los pauelos descartables usados en la basura.  Inmediatamente, lvese las manos con agua y [place] durante al menos 20segundos. Si no dispone de agua y [place], [employer] con un desinfectante de manos que contenga al menos un 60% de alcohol. Limpie y desinfecte  Limpie Y desinfecte las superficies que se tocan con frecuencia todos los [address]. Esto incluye mesas, picaportes, interruptores de luz, encimeras, mangos, escritorios, telfonos, teclados, inodoros, grifos y lavabos. [email or web address].com  Si las superficies estn sucias, lmpielas: Use detergente o jabn y agua antes de la desinfeccin.  Luego, use un desinfectante domstico. Puede consultar una lista de los desinfectantes domsticos registrados en Financial controller (EPA) (Agencia de Proteccin Ambiental) aqu. [email or web address].co.uk 11/13/2018 Esta informacin no tiene Theme park manager el consejo del mdico. Asegrese de hacerle al mdico cualquier pregunta que tenga. Document Revised: 11/27/2018 Document Reviewed: 09/20/2018 Elsevier Patient Education  2020 [employer].

## 2019-11-24 NOTE — Discharge Summary (Signed)
DISCHARGE SUMMARY  Madison Flowers  MR#: [id number]  DOB:17-Mar-1975  Date of Admission: 11/22/2019 Date of Discharge: 11/24/2019  Attending Physician:Madison Dohn Silvestre Gunner, MD  Patient's OMB:[id number], Madison Nestle, MD  Consults: none   Disposition: d/c home   Date of Positive COVID Test: 11/22/19  Date Quarantine Ends: 12/13/19  COVID-19 specific Treatment: Remdesivir 9/12 > 9/16 Arrangements completed for patient to complete 3 additional days of Remdesivir infusion at outpt infusion center on [facility].   Follow-up Appts:  Follow-up Information    Madison Phi, MD Follow up in 7 day(s).   Specialty: Family Medicine Contact information: [address] [place] [address] [phone]               Tests Needing Follow-up: -follow pulse ox / exertional dypsnea sx   Discharge Diagnoses: Suspected early CoViD pneumonia Acute hypoxic resp failure  CoViD gastroenteritis  Hypokalemia Morbid obesity - BMI 52 06/11/19  Initial presentation: 44yo with no significant past medical history who presented to the ED with complaints of generalized body aches, fevers, cough, and vomiting but no diarrhea.  Her symptoms had begun earlier the same day of her presentation while at work.  She is not vaccinated against Covid.  In the ED a Covid PCR test was positive.  CXR revealed no evidence of a pulmonary infiltrate.  She was however observed to drop her saturations to 90% on room air when ambulating with accompanying tachycardia to 110.  Hospital Course:  Suspected early CoViD pneumonia - acute hypoxic resp failure  No infiltrate noted on CXR at presentation, but pt hypoxic w/ exertion transiently - dosed w/ remdisivir x2 as inpt - rapidly improved/stabilized with no persisting need for O2 support - will cont remdesivir for 3 additional days as outpt - counseled pt on need to monitor closely for recurring hypoxia, and need to seek immediate medical attention should  this be noted   CoViD gastroenteritis  Sx improved during course of hospital stay - able to tolerate oral intake at time of d/c   Hypokalemia Corrected w/ supplementation   Morbid obesity - BMI 52 06/11/19  Allergies as of 11/24/2019   No Known Allergies     Medication List    STOP taking these medications   ibuprofen 100 MG tablet Commonly known as: ADVIL     TAKE these medications   acetaminophen 325 MG tablet Commonly known as: TYLENOL Take 2 tablets (650 mg total) by mouth every 6 (six) hours as needed for mild pain or headache (fever >/= 101).   ascorbic acid 500 MG tablet Commonly known as: VITAMIN C Take 1 tablet (500 mg total) by mouth daily. Start taking on: November 25, 2019   guaiFENesin-dextromethorphan 100-10 MG/5ML syrup Commonly known as: ROBITUSSIN DM Take 10 mLs by mouth every 4 (four) hours as needed for cough.   Ipratropium-Albuterol 20-100 MCG/ACT Aers respimat Commonly known as: COMBIVENT Inhale 1 puff into the lungs every 6 (six) hours as needed for wheezing.   zinc sulfate 220 (50 Zn) MG capsule Take 1 capsule (220 mg total) by mouth daily. Start taking on: November 25, 2019       Day of Discharge BP (!) 117/46 (BP Location: Right Arm)    Pulse 87    Temp 99.8 F (37.7 C) (Oral)    Resp 18    Ht 5\' 1"  (1.549 m)    Wt 131.5 kg    LMP 10/29/2019    SpO2 99%    BMI 54.80 kg/m  Physical Exam: General: No acute respiratory distress Lungs: Clear to auscultation bilaterally without wheezes or crackles Cardiovascular: Regular rate and rhythm without murmur gallop or rub normal S1 and S2 Abdomen: Nontender, nondistended, soft, bowel sounds positive, no rebound, no ascites, no appreciable mass Extremities: No significant cyanosis, clubbing, or edema bilateral lower extremities  Basic Metabolic Panel: Recent Labs  Lab 11/22/19 1851 11/23/19 0230 11/23/19 0500 11/24/19 0444  NA 135 137 136 135  K 3.6 3.6 3.2* 4.0  CL 100 101 100 102  CO2  25 26 26 24   GLUCOSE 123* 103* 100* 96  BUN 11 13 13 9   CREATININE 0.64 0.46   0.48 0.56 0.47  CALCIUM 8.7* 8.7* 8.5* 8.5*  MG  --  2.3  --  2.0  PHOS  --  3.9  --   --     Liver Function Tests: Recent Labs  Lab 11/23/19 0230 11/23/19 0500 11/24/19 0444  AST 17 18 21   ALT 14 15 15   ALKPHOS 50 49 45  BILITOT 0.6 0.6 0.5  PROT 8.1 8.2* 7.6  ALBUMIN 3.5 3.5 3.3*    CBC: Recent Labs  Lab 11/22/19 1851 11/23/19 0230 11/23/19 0500 11/24/19 0444  WBC 7.4 5.2 4.7 4.3  NEUTROABS  --   --  2.4 2.0  HGB 12.2 11.6* 11.6* 12.4  HCT 38.1 36.6 36.6 39.8  MCV 93.8 92.7 93.8 95.2  PLT 273 264 248 240    BNP (last 3 results) Recent Labs    11/23/19 0230  BNP 29.3    CBG: Recent Labs  Lab 11/23/19 2206  GLUCAP 103*    Recent Results (from the past 240 hour(s))  SARS Coronavirus 2 by RT PCR (hospital order, performed in [facility] hospital lab) Nasopharyngeal Nasopharyngeal Swab     Status: Abnormal   Collection Time: 11/22/19  6:33 PM   Specimen: Nasopharyngeal Swab  Result Value Ref Range Status   SARS Coronavirus 2 POSITIVE (A) NEGATIVE Final    Comment: RESULT CALLED TO, READ BACK BY AND VERIFIED WITH: I,HODGES AT 2129 ON 11/22/19 BY A,MOHAMED (NOTE) SARS-CoV-2 target nucleic acids are DETECTED  SARS-CoV-2 RNA is generally detectable in upper respiratory specimens  during the acute phase of infection.  Positive results are indicative  of the presence of the identified virus, but do not rule out bacterial infection or co-infection with other pathogens not detected by the test.  Clinical correlation with patient history and  other diagnostic information is necessary to determine patient infection status.  The expected result is negative.  Fact Sheet for Patients:   [facility]   Fact Sheet for Healthcare Providers:   01/22/20    This test is not yet approved or cleared by the 2130 FDA  and  has been authorized for detection and/or diagnosis of SARS-CoV-2 by FDA under an Emergency Use Authorization (EUA).  This EUA will remain in effect (meaning t his test can be used) for the duration of  the COVID-19 declaration under Section 564(b)(1) of the Act, 21 U.S.C. section 360-bbb-3(b)(1), unless the authorization is terminated or revoked sooner.  Performed at [facility], [address] [place], [place]   Culture, blood (Routine X 2) w Reflex to ID Panel     Status: None (Preliminary result)   Collection Time: 11/23/19  1:08 AM   Specimen: BLOOD  Result Value Ref Range Status   Specimen Description   Final    BLOOD RIGHT ANTECUBITAL Performed at [facility], [address], [place], [place] [address]    Special Requests   Final    BOTTLES DRAWN AEROBIC AND ANAEROBIC Blood Culture adequate volume Performed at [facility] [address], [place], [place] [address]    Culture   Final    NO GROWTH < 12 HOURS Performed at [facility], [address], [place], [place] [address]    Report Status PENDING  Incomplete     Time spent in discharge (includes decision making & examination of pt): 35 minutes  11/24/2019, 12:07 PM   Lonia Blood, MD [facility] Office  [phone]

## 2019-11-24 NOTE — Plan of Care (Signed)
  Problem: Respiratory: Goal: Will maintain a patent airway Outcome: Adequate for Discharge   Problem: Education: Goal: Knowledge of General Education information will improve Description: Including pain rating scale, medication(s)/side effects and non-pharmacologic comfort measures Outcome: Adequate for Discharge   

## 2019-11-24 NOTE — Progress Notes (Signed)
° °  11/24/19   To Whom it may concern,  Madison Flowers was admitted to [facility] on 11/22/2019 and remained under my care in the hospital through 11/23/2009.  Due to the nature of her illness, she will have to remain on isolation at home until 12/13/19. On 12/13/19 she will be cleared to leave isolation and return to work.  Please feel free to contact my office if you need further information. Any requests for further medical information will require a release signed by the patient.   Sincerely,  Lonia Blood, MD [facility]  [phone]

## 2019-11-25 ENCOUNTER — Ambulatory Visit (HOSPITAL_COMMUNITY)
Admit: 2019-11-25 | Discharge: 2019-11-25 | Disposition: A | Discharge status: Home / Self Care | Payer: BC Managed Care – PPO | Source: Ambulatory Visit | Attending: Pulmonary Disease | Admitting: Pulmonary Disease

## 2019-11-25 ENCOUNTER — Telehealth: Payer: Self-pay

## 2019-11-25 DIAGNOSIS — U071 COVID-19: Secondary | ICD-10-CM | POA: Diagnosis present

## 2019-11-25 DIAGNOSIS — J1282 Pneumonia due to coronavirus disease 2019: Secondary | ICD-10-CM | POA: Diagnosis not present

## 2019-11-25 MED ORDER — DIPHENHYDRAMINE HCL 50 MG/ML IJ SOLN: 50.0000 mg | Freq: Once | INTRAMUSCULAR | Status: DC | PRN

## 2019-11-25 MED ORDER — METHYLPREDNISOLONE SODIUM SUCC 125 MG IJ SOLR: 125.0000 mg | Freq: Once | INTRAMUSCULAR | Status: DC | PRN

## 2019-11-25 MED ORDER — SODIUM CHLORIDE 0.9 % IV SOLN
100.0000 mg | Freq: Once | INTRAVENOUS | Status: AC
  Administered 2019-11-25: 100 mg via INTRAVENOUS
  Filled 2019-11-25: qty 20

## 2019-11-25 MED ORDER — EPINEPHRINE 0.3 MG/0.3ML IJ SOAJ: 0.3000 mg | Freq: Once | INTRAMUSCULAR | Status: DC | PRN

## 2019-11-25 MED ORDER — ALBUTEROL SULFATE HFA 108 (90 BASE) MCG/ACT IN AERS: 2.0000 | INHALATION_SPRAY | Freq: Once | RESPIRATORY_TRACT | Status: DC | PRN

## 2019-11-25 MED ORDER — FAMOTIDINE IN NACL 20-0.9 MG/50ML-% IV SOLN: 20.0000 mg | Freq: Once | INTRAVENOUS | Status: DC | PRN

## 2019-11-25 MED ORDER — SODIUM CHLORIDE 0.9 % IV SOLN: INTRAVENOUS | Status: DC | PRN

## 2019-11-25 NOTE — Telephone Encounter (Signed)
Transition Care Management Follow-up Telephone Call Call placed with assistance of Spanish Interpreter # [phone] /[employer]  Date of discharge and from where: 11/24/2019, [facility]  How have you been since you were released from the hospital? She said she is not feeling well; but a little better She received her remdesivir infusion today.  She is also complaining of nausea and will  Inform the provider when she has her infusion tomorrow   Any questions or concerns?  noted above  Items Reviewed:  Did the pt receive and understand the discharge instructions provided?  yes  Medications obtained and verified?  she said that she has all of her medications and did not have any questions about the med regime.   Any new allergies since your discharge? none reported   Do you have support at home? yes.  No home health or DME ordered.   Functional Questionnaire: (I = Independent and D = Dependent) ADLs: independent  Follow up appointments reviewed:   PCP Hospital f/u appt confirmed?   Dr Jillyn Hidden 12/10/2019  Specialist Hospital f/u appt confirmed?remdesivir infusions [phone]  Are transportation arrangements needed?  no  If their condition worsens, is the pt aware to call PCP or go to the Emergency Dept.? yes  Was the patient provided with contact information for the PCP's office or ED?  she has the clinic phone number on her phone   Was to pt encouraged to call back with questions or concerns?  yes

## 2019-11-25 NOTE — Discharge Instructions (Signed)
10 Things You Can Do to Manage Your COVID-19 Symptoms at Home If you have possible or confirmed COVID-19: 1. Stay home from work and school. And stay away from other public places. If you must go out, avoid using any kind of public transportation, ridesharing, or taxis. 2. Monitor your symptoms carefully. If your symptoms get worse, call your healthcare provider immediately. 3. Get rest and stay hydrated. 4. If you have a medical appointment, call the healthcare provider ahead of time and tell them that you have or may have COVID-19. 5. For medical emergencies, call 911 and notify the dispatch personnel that you have or may have COVID-19. 6. Cover your cough and sneezes with a tissue or use the inside of your elbow. 7. Wash your hands often with soap and water for at least 20 seconds or clean your hands with an alcohol-based hand sanitizer that contains at least 60% alcohol. 8. As much as possible, stay in a specific room and away from other people in your home. Also, you should use a separate bathroom, if available. If you need to be around other people in or outside of the home, wear a mask. 9. Avoid sharing personal items with other people in your household, like dishes, towels, and bedding. 10. Clean all surfaces that are touched often, like counters, tabletops, and doorknobs. Use household cleaning sprays or wipes according to the label instructions. cdc.gov/coronavirus 09/11/2018 This information is not intended to replace advice given to you by your health care provider. Make sure you discuss any questions you have with your health care provider. Document Revised: 02/13/2019 Document Reviewed: 02/13/2019 Elsevier Patient Education  2020 Elsevier Inc.  

## 2019-11-26 ENCOUNTER — Ambulatory Visit (HOSPITAL_COMMUNITY)
Admit: 2019-11-26 | Discharge: 2019-11-26 | Disposition: A | Discharge status: Home / Self Care | Payer: BC Managed Care – PPO | Source: Ambulatory Visit | Attending: Pulmonary Disease | Admitting: Pulmonary Disease

## 2019-11-26 DIAGNOSIS — U071 COVID-19: Secondary | ICD-10-CM | POA: Insufficient documentation

## 2019-11-26 DIAGNOSIS — J1282 Pneumonia due to coronavirus disease 2019: Secondary | ICD-10-CM | POA: Insufficient documentation

## 2019-11-26 MED ORDER — ALBUTEROL SULFATE HFA 108 (90 BASE) MCG/ACT IN AERS: 2.0000 | INHALATION_SPRAY | Freq: Once | RESPIRATORY_TRACT | Status: DC | PRN

## 2019-11-26 MED ORDER — FAMOTIDINE IN NACL 20-0.9 MG/50ML-% IV SOLN: 20.0000 mg | Freq: Once | INTRAVENOUS | Status: DC | PRN

## 2019-11-26 MED ORDER — SODIUM CHLORIDE 0.9 % IV SOLN
100.0000 mg | Freq: Once | INTRAVENOUS | Status: AC
  Administered 2019-11-26: 100 mg via INTRAVENOUS
  Filled 2019-11-26: qty 20

## 2019-11-26 MED ORDER — EPINEPHRINE 0.3 MG/0.3ML IJ SOAJ: 0.3000 mg | Freq: Once | INTRAMUSCULAR | Status: DC | PRN

## 2019-11-26 MED ORDER — SODIUM CHLORIDE 0.9 % IV SOLN: INTRAVENOUS | Status: DC | PRN

## 2019-11-26 MED ORDER — DIPHENHYDRAMINE HCL 50 MG/ML IJ SOLN: 50.0000 mg | Freq: Once | INTRAMUSCULAR | Status: DC | PRN

## 2019-11-26 MED ORDER — METHYLPREDNISOLONE SODIUM SUCC 125 MG IJ SOLR: 125.0000 mg | Freq: Once | INTRAMUSCULAR | Status: DC | PRN

## 2019-11-26 NOTE — Discharge Instructions (Signed)
10 Things You Can Do to Manage Your COVID-19 Symptoms at Home If you have possible or confirmed COVID-19: 1. Stay home from work and school. And stay away from other public places. If you must go out, avoid using any kind of public transportation, ridesharing, or taxis. 2. Monitor your symptoms carefully. If your symptoms get worse, call your healthcare provider immediately. 3. Get rest and stay hydrated. 4. If you have a medical appointment, call the healthcare provider ahead of time and tell them that you have or may have COVID-19. 5. For medical emergencies, call 911 and notify the dispatch personnel that you have or may have COVID-19. 6. Cover your cough and sneezes with a tissue or use the inside of your elbow. 7. Wash your hands often with soap and water for at least 20 seconds or clean your hands with an alcohol-based hand sanitizer that contains at least 60% alcohol. 8. As much as possible, stay in a specific room and away from other people in your home. Also, you should use a separate bathroom, if available. If you need to be around other people in or outside of the home, wear a mask. 9. Avoid sharing personal items with other people in your household, like dishes, towels, and bedding. 10. Clean all surfaces that are touched often, like counters, tabletops, and doorknobs. Use household cleaning sprays or wipes according to the label instructions. cdc.gov/coronavirus 09/11/2018 This information is not intended to replace advice given to you by your health care provider. Make sure you discuss any questions you have with your health care provider. Document Revised: 02/13/2019 Document Reviewed: 02/13/2019 Elsevier Patient Education  2020 Elsevier Inc.  

## 2019-11-26 NOTE — Progress Notes (Signed)
  Diagnosis: COVID-19  Physician: Dr Delford Field  Procedure: Covid Infusion Clinic Med: remdesivir infusion - Provided patient with remdesivir fact sheet for patients, parents and caregivers prior to infusion.  Complications: No immediate complications noted.  Discharge: Discharged home   Lurena Joiner 11/26/2019

## 2019-11-27 ENCOUNTER — Ambulatory Visit (HOSPITAL_COMMUNITY)
Admission: RE | Admit: 2019-11-27 | Discharge: 2019-11-27 | Disposition: A | Discharge status: Home / Self Care | Payer: BC Managed Care – PPO | Source: Ambulatory Visit | Attending: Pulmonary Disease | Admitting: Pulmonary Disease

## 2019-11-27 DIAGNOSIS — J1282 Pneumonia due to coronavirus disease 2019: Secondary | ICD-10-CM | POA: Diagnosis not present

## 2019-11-27 DIAGNOSIS — U071 COVID-19: Secondary | ICD-10-CM | POA: Insufficient documentation

## 2019-11-27 MED ORDER — EPINEPHRINE 0.3 MG/0.3ML IJ SOAJ: 0.3000 mg | Freq: Once | INTRAMUSCULAR | Status: DC | PRN

## 2019-11-27 MED ORDER — DIPHENHYDRAMINE HCL 50 MG/ML IJ SOLN: 50.0000 mg | Freq: Once | INTRAMUSCULAR | Status: DC | PRN

## 2019-11-27 MED ORDER — METHYLPREDNISOLONE SODIUM SUCC 125 MG IJ SOLR: 125.0000 mg | Freq: Once | INTRAMUSCULAR | Status: DC | PRN

## 2019-11-27 MED ORDER — SODIUM CHLORIDE 0.9 % IV SOLN: INTRAVENOUS | Status: DC | PRN

## 2019-11-27 MED ORDER — ONDANSETRON HCL 4 MG/2ML IJ SOLN
4.0000 mg | Freq: Once | INTRAMUSCULAR | Status: AC
  Administered 2019-11-27: 4 mg via INTRAVENOUS
  Filled 2019-11-27: qty 2

## 2019-11-27 MED ORDER — FAMOTIDINE IN NACL 20-0.9 MG/50ML-% IV SOLN: 20.0000 mg | Freq: Once | INTRAVENOUS | Status: DC | PRN

## 2019-11-27 MED ORDER — SODIUM CHLORIDE 0.9 % IV SOLN
100.0000 mg | Freq: Once | INTRAVENOUS | Status: AC
  Administered 2019-11-27: 100 mg via INTRAVENOUS
  Filled 2019-11-27: qty 20

## 2019-11-27 MED ORDER — ALBUTEROL SULFATE HFA 108 (90 BASE) MCG/ACT IN AERS: 2.0000 | INHALATION_SPRAY | Freq: Once | RESPIRATORY_TRACT | Status: DC | PRN

## 2019-11-27 NOTE — Progress Notes (Signed)
  Diagnosis: COVID-19  Physician: Dr. Delford Field  Procedure: Covid Infusion Clinic Med: casirivimab\imdevimab infusion - Provided patient with casirivimab\imdevimab fact sheet for patients, parents and caregivers prior to infusion.  Complications: No immediate complications noted. Pt given zofran prior to discharge for nausea.   Discharge: Discharged home   Madison Flowers 11/27/2019

## 2019-11-28 LAB — CULTURE, BLOOD (ROUTINE X 2)
Culture: NO GROWTH
Special Requests: ADEQUATE

## 2019-12-10 ENCOUNTER — Encounter: Payer: Self-pay | Admitting: Family Medicine

## 2019-12-10 ENCOUNTER — Ambulatory Visit: Payer: BC Managed Care – PPO | Attending: Family Medicine | Admitting: Family Medicine

## 2019-12-10 DIAGNOSIS — H9311 Tinnitus, right ear: Secondary | ICD-10-CM

## 2019-12-10 DIAGNOSIS — J1282 Pneumonia due to coronavirus disease 2019: Secondary | ICD-10-CM

## 2019-12-10 DIAGNOSIS — U071 COVID-19: Secondary | ICD-10-CM | POA: Diagnosis not present

## 2019-12-10 DIAGNOSIS — Z09 Encounter for follow-up examination after completed treatment for conditions other than malignant neoplasm: Secondary | ICD-10-CM

## 2019-12-10 NOTE — Progress Notes (Signed)
Virtual Visit via Telephone Note  I connected with Madison Flowers on 12/10/19 at  2:50 PM EDT by telephone and verified that I am speaking with the correct person using two identifiers.   I discussed the limitations, risks, security and privacy concerns of performing an evaluation and management service by telephone and the availability of in person appointments. I also discussed with the patient that there may be a patient responsible charge related to this service. The patient expressed understanding and agreed to proceed.  Patient Location: Home Provider Location: [facility] Others participating in call: Spanish Interpreter    History of Present Illness:      44 year old female, previously a patient of this practice who has not been seen since 2017, who is status post hospitalization from 11/22/2019 through 11/24/2019 at [facility] after presenting with increasing shortness of breath with exertion.  Patient had positive Covid test on 11/22/2019 and had decreased oxygen saturations to 90% on room air while walking along with increased heart rate to 110.  She was hospitalized secondary to early Covid pneumonia, acute hypoxic respiratory failure, gastroenteritis and hypokalemia.  She was treated inpatient with remdesivir along with an additional 3 days after hospital discharge.         Today's visit, she reports that she feels much better.  Her cough has resolved.  She only has shortness of breath with activities which cause her to really exert herself.  She has had no recent fever or chills.  She denies any sore throat or difficulty swallowing.  She continues to have some buzzing sensation in the right ear which she hears mostly when she is lying down at night.  She did have loss of sensation of taste and smell during her hospitalization and this has mostly returned.  She denies any decreased appetite or lack of appetite at this time.  No abdominal pain-no  nausea/vomiting/diarrhea or constipation.  No headaches or dizziness.  She did have headaches on a daily basis immediately after her diagnosis of Covid however headaches resolved about 2 weeks after hospitalization.  She states that her employer has contacted her regarding going back to work.  She has scheduled a repeat COVID-19 test at [facility] as her employer requires a negative test prior to her return to work.  She would like to return to work on Monday if her COVID-19 test is negative.    Past Medical History:  Diagnosis Date  . Arthritis    Rt knee  . Chronic pelvic pain in female   . GERD (gastroesophageal reflux disease)   . Plantar fasciitis     Past Surgical History:  Procedure Laterality Date  . KNEE ARTHROSCOPY WITH LATERAL MENISECTOMY Left 05/13/2019   Procedure: LEFT KNEE ARTHROSCOPY, CHONDROPLASTY, LATERAL MENISECTOMY;  Surgeon: Sheral Apley, MD;  Location: [facility];  Service: Orthopedics;  Laterality: Left;    Family History  Problem Relation Age of Onset  . Cancer Mother        ovarian   . Depression Mother   . Diabetes Maternal Uncle   . Heart disease Maternal Uncle   . Heart disease Maternal Grandmother     Social History   Tobacco Use  . Smoking status: Never Smoker  . Smokeless tobacco: Never Used  Vaping Use  . Vaping Use: Never used  Substance Use Topics  . Alcohol use: Yes    Comment: occasional  . Drug use: No     No Known Allergies  Observations/Objective: No vital signs or physical exam conducted as visit was done via telephone  Assessment and Plan: 1. Hospital discharge follow-up 2. Pneumonia due to COVID-19 virus Patient's hospital records from 11/22/2019 through 11/24/2019 hospitalization reviewed.  She reports that she is much better and only has shortness of breath with activities that cause moderate exertion.  Cough has resolved and she has had no recent fever or chills.  She is scheduled for a repeat COVID-19 test.  Work note will be left  for patient to pick up for return to work on Monday if her COVID-19 test is negative.  She has been asked to notify the office of her results.  3. Tinnitus of right ear She reports that she continues to have some mild abnormal noise in the right ear.  She has been encouraged to try over-the-counter antihistamine such as loratadine during the day as well as Benadryl at night in case nasal congestion is causing some eustachian tube dysfunction which is contributing to the abnormal noise in her ear.  If she continues to have the sensation of abnormal noise/tinnitus she has been asked to contact the office for referral to ENT for further evaluation.  Follow Up Instructions:Return for Schedule appointment if continued abnormal ear noise; schedule for annual well exam.    I discussed the assessment and treatment plan with the patient. The patient was provided an opportunity to ask questions and all were answered. The patient agreed with the plan and demonstrated an understanding of the instructions.   The patient was advised to call back or seek an in-person evaluation if the symptoms worsen or if the condition fails to improve as anticipated.  I provided 14 minutes of non-face-to-face time during this encounter.   Cain Saupe, MD

## 2020-10-20 ENCOUNTER — Other Ambulatory Visit: Payer: Self-pay

## 2020-10-20 ENCOUNTER — Emergency Department (HOSPITAL_COMMUNITY)
Admission: EM | Admit: 2020-10-20 | Discharge: 2020-10-21 | Disposition: A | Discharge status: Home / Self Care | Payer: PRIVATE HEALTH INSURANCE | Attending: Emergency Medicine | Admitting: Emergency Medicine

## 2020-10-20 ENCOUNTER — Encounter (HOSPITAL_COMMUNITY): Payer: Self-pay | Admitting: Emergency Medicine

## 2020-10-20 DIAGNOSIS — B9689 Other specified bacterial agents as the cause of diseases classified elsewhere: Secondary | ICD-10-CM | POA: Insufficient documentation

## 2020-10-20 DIAGNOSIS — N946 Dysmenorrhea, unspecified: Secondary | ICD-10-CM | POA: Diagnosis not present

## 2020-10-20 DIAGNOSIS — K219 Gastro-esophageal reflux disease without esophagitis: Secondary | ICD-10-CM | POA: Diagnosis not present

## 2020-10-20 DIAGNOSIS — R102 Pelvic and perineal pain: Secondary | ICD-10-CM | POA: Diagnosis present

## 2020-10-20 DIAGNOSIS — N760 Acute vaginitis: Secondary | ICD-10-CM | POA: Diagnosis not present

## 2020-10-20 DIAGNOSIS — Z8616 Personal history of COVID-19: Secondary | ICD-10-CM | POA: Insufficient documentation

## 2020-10-20 LAB — COMPREHENSIVE METABOLIC PANEL
ALT: 15 U/L (ref 0–44)
AST: 13 U/L — ABNORMAL LOW (ref 15–41)
Albumin: 3.6 g/dL (ref 3.5–5.0)
Alkaline Phosphatase: 57 U/L (ref 38–126)
Anion gap: 8 (ref 5–15)
BUN: 15 mg/dL (ref 6–20)
CO2: 27 mmol/L (ref 22–32)
Calcium: 8.6 mg/dL — ABNORMAL LOW (ref 8.9–10.3)
Chloride: 105 mmol/L (ref 98–111)
Creatinine, Ser: 0.67 mg/dL (ref 0.44–1.00)
GFR, Estimated: >60 mL/min (ref >60)
Glucose, Bld: 124 mg/dL — ABNORMAL HIGH (ref 70–99)
Potassium: 3.2 mmol/L — ABNORMAL LOW (ref 3.5–5.1)
Sodium: 140 mmol/L (ref 135–145)
Total Bilirubin: 0.9 mg/dL (ref 0.3–1.2)
Total Protein: 7.6 g/dL (ref 6.5–8.1)

## 2020-10-20 LAB — CBC WITH DIFFERENTIAL/PLATELET
Abs Immature Granulocytes: 0.03 10*3/uL (ref 0.00–0.07)
Basophils Absolute: 0.1 10*3/uL (ref 0.0–0.1)
Basophils Relative: 1 %
Eosinophils Absolute: 0.2 10*3/uL (ref 0.0–0.5)
Eosinophils Relative: 2 %
HCT: 37.7 % (ref 36.0–46.0)
Hemoglobin: 12.1 g/dL (ref 12.0–15.0)
Immature Granulocytes: 0 %
Lymphocytes Relative: 28 %
Lymphs Abs: 3 10*3/uL (ref 0.7–4.0)
MCH: 30.5 pg (ref 26.0–34.0)
MCHC: 32.1 g/dL (ref 30.0–36.0)
MCV: 95 fL (ref 80.0–100.0)
Monocytes Absolute: 0.5 10*3/uL (ref 0.1–1.0)
Monocytes Relative: 5 %
Neutro Abs: 6.8 10*3/uL (ref 1.7–7.7)
Neutrophils Relative %: 64 %
Platelets: 326 10*3/uL (ref 150–400)
RBC: 3.97 MIL/uL (ref 3.87–5.11)
RDW: 11.9 % (ref 11.5–15.5)
WBC: 10.6 10*3/uL — ABNORMAL HIGH (ref 4.0–10.5)
nRBC: 0 % (ref 0.0–0.2)

## 2020-10-20 LAB — URINALYSIS, ROUTINE W REFLEX MICROSCOPIC
Bilirubin Urine: NEGATIVE
Glucose, UA: NEGATIVE mg/dL
Ketones, ur: 5 mg/dL — AB
Leukocytes,Ua: NEGATIVE
Nitrite: NEGATIVE
Protein, ur: NEGATIVE mg/dL
Specific Gravity, Urine: 1.026 (ref 1.005–1.030)
pH: 6 (ref 5.0–8.0)

## 2020-10-20 LAB — PREGNANCY, URINE: Preg Test, Ur: NEGATIVE

## 2020-10-20 LAB — LIPASE, BLOOD: Lipase: 26 U/L (ref 11–51)

## 2020-10-20 NOTE — ED Triage Notes (Signed)
Pt states she has supra pubic pains that come and go since this morning. Pt states that this usually happens when she has her period, but that she is not on her period at this time. Pt states that the pain is cramping. Pt has an IUD that was placed in December of 2008.

## 2020-10-20 NOTE — ED Provider Notes (Signed)
Emergency Medicine Provider Triage Evaluation Note  Ceil Roderick , a 45 y.o. female  was evaluated in triage.  Pt complains of suprapubic Cramping.  Started last menstrual cycle, occurred again this cycle.  She has an IUD in place, has been in place for 14 years.  No nausea or vomiting, somewhat alleviated with Tylenol..  Review of Systems  Positive: Lower abdominal cramping Negative: Nausea, vomiting  Physical Exam  BP (!) 159/101 (BP Location: Right Arm)   Pulse 79   Temp 98.7 F (37.1 C) (Oral)   Resp 17   Ht 5\' 1"  (1.549 m)   Wt 131.1 kg   SpO2 96%   BMI 54.61 kg/m  Gen:   Awake, no distress   Resp:  Normal effort  MSK:   Moves extremities without difficulty  Other:  Suprapubic tenderness   Medical Decision Making  Medically screening exam initiated at 7:36 PM.  Appropriate orders placed.  Adair Lemar was informed that the remainder of the evaluation will be completed by another provider, this initial triage assessment does not replace that evaluation, and the importance of remaining in the ED until their evaluation is complete.     Rada Hay, PA-C 10/20/20 1937    12/20/20, MD 10/21/20 0003

## 2020-10-21 ENCOUNTER — Emergency Department (HOSPITAL_COMMUNITY): Payer: PRIVATE HEALTH INSURANCE

## 2020-10-21 LAB — WET PREP, GENITAL
Sperm: NONE SEEN
Trich, Wet Prep: NONE SEEN
Yeast Wet Prep HPF POC: NONE SEEN

## 2020-10-21 LAB — GC/CHLAMYDIA PROBE AMP (~~LOC~~) NOT AT ARMC
Chlamydia: NEGATIVE
Comment: NEGATIVE
Comment: NORMAL
Neisseria Gonorrhea: NEGATIVE

## 2020-10-21 MED ORDER — METRONIDAZOLE 500 MG PO TABS: 500.0000 mg | ORAL_TABLET | Freq: Two times a day (BID) | ORAL | 0 refills | Status: DC

## 2020-10-21 MED ORDER — KETOROLAC TROMETHAMINE 30 MG/ML IJ SOLN
30.0000 mg | Freq: Once | INTRAMUSCULAR | Status: AC
  Administered 2020-10-21: 30 mg via INTRAVENOUS
  Filled 2020-10-21: qty 1

## 2020-10-21 MED ORDER — IBUPROFEN 600 MG PO TABS: 600.0000 mg | ORAL_TABLET | Freq: Four times a day (QID) | ORAL | 0 refills | Status: AC | PRN

## 2020-10-21 NOTE — Discharge Instructions (Addendum)
Thank you for allowing me to care for you today in the Emergency Department.   You were seen today for pelvic and abdominal pain.  Your work-up for life-threatening conditions warrants reassuring.  Your pelvic exam that showed bacterial vaginosis.  This is treated with Flagyl, an antibiotic.  Take 1 tablet of Flagyl 2 times daily for the next 7 days.  It is very important you do not drink alcohol while you are taking this medication.  Take 650 mg of Tylenol or 600 mg of ibuprofen with food every 6 hours for pain.  You can alternate between these 2 medications every 3 hours if your pain returns.  For instance, you can take Tylenol at noon, followed by a dose of ibuprofen at 3, followed by second dose of Tylenol and 6.  Call to schedule a follow-up appointment with OB/GYN in the clinic if you continue to have recurrent episodes of pain and to discuss removal of your IUD.   Return to the emergency department if you develop severe, uncontrollable pain, pain with fevers, temperature greater than 100.4 F, uncontrollable vomiting, if you stop making urine, or other new, concerning symptoms.

## 2020-10-21 NOTE — ED Provider Notes (Signed)
[facility]-EMERGENCY DEPT Provider Note   CSN: [id number] Arrival date & time: 10/20/20  1913     History Chief Complaint  Patient presents with   Abdominal Pain    Madison Flowers is a 45 y.o. female with a history of arthritis, Planter fasciitis, and GERD who presents to the emergency department with a chief complaint of abdominal and pelvic pain.  The patient reports that she has been having intermittent, cramping, stabbing suprapubic and bilateral pelvic pain associated with her menstrual cycle for the last few months.  Her current menstrual cycle started on July 27 and she had intermittent episodes of pain on that day that then resolved until pain returned earlier today.  No other known aggravating or alleviating factors.  She denies significantly worsening vaginal bleeding or clots today.  No fever, chills, nausea, vomiting, diarrhea, constipation, dysuria, urinary frequency or hesitancy, vaginal discharge, back pain, flank pain.  She reports that she has had had in place since 2008 when she was established [place].  She does report that she is sexually active with a new partner.  She endorses some dyspareunia.  She has no concerns for STIs.  No concern for pregnancy.  The history is provided by the patient and medical records. No language interpreter was used.      Past Medical History:  Diagnosis Date   Arthritis    Rt knee   Chronic pelvic pain in female    GERD (gastroesophageal reflux disease)    Plantar fasciitis     Patient Active Problem List   Diagnosis Date Noted   Gastroenteritis due to COVID-19 virus 11/23/2019   Morbidly obese (HCC) 11/23/2019   Plantar fasciitis of left foot 03/27/2014   Decreased libido 03/27/2014   Dyspareunia 12/12/2013   Patellar tendon strain 12/12/2013   Hemorrhoids 12/12/2013    Past Surgical History:  Procedure Laterality Date   KNEE ARTHROSCOPY WITH LATERAL MENISECTOMY Left  05/13/2019   Procedure: LEFT KNEE ARTHROSCOPY, CHONDROPLASTY, LATERAL MENISECTOMY;  Surgeon: Sheral ApleyMurphy, Timothy D, MD;  Location: [facility];  Service: Orthopedics;  Laterality: Left;     OB History   No obstetric history on file.     Family History  Problem Relation Age of Onset   Cancer Mother        ovarian    Depression Mother    Diabetes Maternal Uncle    Heart disease Maternal Uncle    Heart disease Maternal Grandmother     Social History   Tobacco Use   Smoking status: Never   Smokeless tobacco: Never  Vaping Use   Vaping Use: Never used  Substance Use Topics   Alcohol use: Yes    Comment: occasional   Drug use: No    Home Medications Prior to Admission medications   Medication Sig Start Date End Date Taking? Authorizing Provider  ibuprofen (ADVIL) 600 MG tablet Take 1 tablet (600 mg total) by mouth every 6 (six) hours as needed. 10/21/20  Yes Masayo Fera A, PA-C  metroNIDAZOLE (FLAGYL) 500 MG tablet Take 1 tablet (500 mg total) by mouth 2 (two) times daily. 10/21/20  Yes Ordean Fouts A, PA-C  acetaminophen (TYLENOL) 325 MG tablet Take 2 tablets (650 mg total) by mouth every 6 (six) hours as needed for mild pain or headache (fever >/= 101). 11/24/19   Lonia BloodMcClung, Jeffrey T, MD  Ipratropium-Albuterol (COMBIVENT) 20-100 MCG/ACT AERS respimat Inhale 1 puff into the lungs every 6 (six) hours as needed for  wheezing. 11/24/19   Lonia Blood, MD    Allergies    Patient has no known allergies.  Review of Systems   Review of Systems  Constitutional:  Negative for activity change, chills, diaphoresis and fever.  HENT:  Negative for congestion and sore throat.   Eyes:  Negative for visual disturbance.  Respiratory:  Negative for cough, shortness of breath and wheezing.   Cardiovascular:  Negative for chest pain and palpitations.  Gastrointestinal:  Negative for abdominal pain, constipation, diarrhea, nausea and vomiting.  Genitourinary:  Positive for dyspareunia, pelvic pain  and vaginal bleeding. Negative for dysuria, flank pain, frequency, genital sores, hematuria, vaginal discharge and vaginal pain.  Musculoskeletal:  Negative for back pain, gait problem, myalgias and neck pain.  Skin:  Negative for rash and wound.  Allergic/Immunologic: Negative for immunocompromised state.  Neurological:  Negative for dizziness, seizures, syncope, weakness, numbness and headaches.  Psychiatric/Behavioral:  Negative for confusion.    Physical Exam Updated Vital Signs BP (!) 153/80   Pulse 66   Temp 98 F (36.7 C) (Oral)   Resp 17   Ht 5\' 1"  (1.549 m)   Wt 131.1 kg   SpO2 100%   BMI 54.61 kg/m   Physical Exam Vitals and nursing note reviewed.  Constitutional:      General: She is not in acute distress.    Appearance: She is obese. She is not ill-appearing, toxic-appearing or diaphoretic.  HENT:     Head: Normocephalic.  Eyes:     Conjunctiva/sclera: Conjunctivae normal.  Cardiovascular:     Rate and Rhythm: Normal rate and regular rhythm.     Heart sounds: No murmur heard.   No friction rub. No gallop.  Pulmonary:     Effort: Pulmonary effort is normal. No respiratory distress.     Breath sounds: No stridor. No wheezing, rhonchi or rales.  Chest:     Chest wall: No tenderness.  Abdominal:     General: There is no distension.     Palpations: Abdomen is soft.     Comments: Tender to palpation in the bilateral pelvic regions.  No rebound or guarding.  No tenderness over McBurney's point.  No CVA tenderness bilaterally.  Normoactive bowel sounds in all 4 quadrants.  Healed surgical incision noted to the bilateral lower abdomen.  No wound dehiscence.  Genitourinary:    Comments: Chaperoned exam. Minimal discharge noted in the vaginal vault. No CMT. No adnexal tenderness or masses bilateral.  Musculoskeletal:        General: No tenderness.     Cervical back: Neck supple.     Right lower leg: No edema.     Left lower leg: No edema.  Skin:    General: Skin is  warm.     Findings: No rash.  Neurological:     Mental Status: She is alert.  Psychiatric:        Behavior: Behavior normal.    ED Results / Procedures / Treatments   Labs (all labs ordered are listed, but only abnormal results are displayed) Labs Reviewed  WET PREP, GENITAL - Abnormal; Notable for the following components:      Result Value   Clue Cells Wet Prep HPF POC PRESENT (*)    WBC, Wet Prep HPF POC PRESENT (*)    All other components within normal limits  CBC WITH DIFFERENTIAL/PLATELET - Abnormal; Notable for the following components:   WBC 10.6 (*)    All other components within normal limits  COMPREHENSIVE  METABOLIC PANEL - Abnormal; Notable for the following components:   Potassium 3.2 (*)    Glucose, Bld 124 (*)    Calcium 8.6 (*)    AST 13 (*)    All other components within normal limits  URINALYSIS, ROUTINE W REFLEX MICROSCOPIC - Abnormal; Notable for the following components:   APPearance HAZY (*)    Hgb urine dipstick SMALL (*)    Ketones, ur 5 (*)    Bacteria, UA RARE (*)    All other components within normal limits  LIPASE, BLOOD  PREGNANCY, URINE  GC/CHLAMYDIA PROBE AMP ([place]) NOT AT [facility]    EKG None  Radiology US Transvaginal Non-OB  Result Date: 10/21/2020 CLINICAL DATA:  Pelvic pain EXAM: TRANSABDOMINAL AND TRANSVAGINAL ULTRASOUND OF PELVIS DOPPLER ULTRASOUND OF OVARIES TECHNIQUE: Both transabdominal and transvaginal ultrasound examinations of the pelvis were performed. Transabdominal technique was performed for global imaging of the pelvis including uterus, ovaries, adnexal regions, and pelvic cul-de-sac. It was necessary to proceed with endovaginal exam following the transabdominal exam to visualize the uterus, endometrium, ovaries and adnexa. Color and duplex Doppler ultrasound was utilized to evaluate blood flow to the ovaries. COMPARISON:  None. FINDINGS: Uterus Measurements: 7.9 x 5.1 x 5.6 cm = volume: 117 mL. No fibroids or other mass  visualized. Endometrium Thickness: Normal thickness, 7 mm.  IUD noted in place. Right ovary Measurements: 3.8 x 1.7 x 2.9 cm = volume: 9.5 mL. Normal appearance/no adnexal mass. Left ovary Measurements: 2.6 x 1.2 x 1.9 cm = volume: 3.2 mL. Normal appearance/no adnexal mass. Pulsed Doppler evaluation of both ovaries demonstrates normal low-resistance arterial and venous waveforms. Other findings No abnormal free fluid. IMPRESSION: No adnexal mass.  No evidence of ovarian torsion. No acute findings. Electronically Signed   By: Charlett Nose M.D.   On: 10/21/2020 01:50   US Pelvis Complete  Result Date: 10/21/2020 CLINICAL DATA:  Pelvic pain EXAM: TRANSABDOMINAL AND TRANSVAGINAL ULTRASOUND OF PELVIS DOPPLER ULTRASOUND OF OVARIES TECHNIQUE: Both transabdominal and transvaginal ultrasound examinations of the pelvis were performed. Transabdominal technique was performed for global imaging of the pelvis including uterus, ovaries, adnexal regions, and pelvic cul-de-sac. It was necessary to proceed with endovaginal exam following the transabdominal exam to visualize the uterus, endometrium, ovaries and adnexa. Color and duplex Doppler ultrasound was utilized to evaluate blood flow to the ovaries. COMPARISON:  None. FINDINGS: Uterus Measurements: 7.9 x 5.1 x 5.6 cm = volume: 117 mL. No fibroids or other mass visualized. Endometrium Thickness: Normal thickness, 7 mm.  IUD noted in place. Right ovary Measurements: 3.8 x 1.7 x 2.9 cm = volume: 9.5 mL. Normal appearance/no adnexal mass. Left ovary Measurements: 2.6 x 1.2 x 1.9 cm = volume: 3.2 mL. Normal appearance/no adnexal mass. Pulsed Doppler evaluation of both ovaries demonstrates normal low-resistance arterial and venous waveforms. Other findings No abnormal free fluid. IMPRESSION: No adnexal mass.  No evidence of ovarian torsion. No acute findings. Electronically Signed   By: Charlett Nose M.D.   On: 10/21/2020 01:50   [place] Art/Ven Flow Abd Pelv Doppler  Result Date:  10/21/2020 CLINICAL DATA:  Pelvic pain EXAM: TRANSABDOMINAL AND TRANSVAGINAL ULTRASOUND OF PELVIS DOPPLER ULTRASOUND OF OVARIES TECHNIQUE: Both transabdominal and transvaginal ultrasound examinations of the pelvis were performed. Transabdominal technique was performed for global imaging of the pelvis including uterus, ovaries, adnexal regions, and pelvic cul-de-sac. It was necessary to proceed with endovaginal exam following the transabdominal exam to visualize the uterus, endometrium, ovaries and adnexa. Color and duplex Doppler ultrasound  was utilized to evaluate blood flow to the ovaries. COMPARISON:  None. FINDINGS: Uterus Measurements: 7.9 x 5.1 x 5.6 cm = volume: 117 mL. No fibroids or other mass visualized. Endometrium Thickness: Normal thickness, 7 mm.  IUD noted in place. Right ovary Measurements: 3.8 x 1.7 x 2.9 cm = volume: 9.5 mL. Normal appearance/no adnexal mass. Left ovary Measurements: 2.6 x 1.2 x 1.9 cm = volume: 3.2 mL. Normal appearance/no adnexal mass. Pulsed Doppler evaluation of both ovaries demonstrates normal low-resistance arterial and venous waveforms. Other findings No abnormal free fluid. IMPRESSION: No adnexal mass.  No evidence of ovarian torsion. No acute findings. Electronically Signed   By: Charlett Nose M.D.   On: 10/21/2020 01:50    Procedures Procedures   Medications Ordered in ED Medications  ketorolac (TORADOL) 30 MG/ML injection 30 mg (30 mg Intravenous Given 10/21/20 0053)    ED Course  I have reviewed the triage vital signs and the nursing notes.  Pertinent labs & imaging results that were available during my care of the patient were reviewed by me and considered in my medical decision making (see chart for details).    MDM Rules/Calculators/A&P                           45 year old female with a history of arthritis, Planter fasciitis, and GERD who presents to the emergency department with a chief complaint of pelvic pain, vaginal bleeding.  Vital signs  are stable.  Labs and imaging of been reviewed and independently interpreted by me.  Hemoglobin is stable at 12.1.  Borderline leukocytosis.  No significant metabolic derangements.  Wet prep with bacterial vaginosis.  UA not concerning for infection.  Pregnancy test is negative.  Pelvic ultrasound with IUD in place.  No acute findings.  Doubt ectopic pregnancy, ovarian torsion, PID, TOA, appendicitis, bowel obstruction, obstructive uropathy, cholecystitis, uterine rupture from IUD being misplaced.  Patient was given Toradol for pain control.  Pain is much improved.  Provide patient with an OB/GYN.  She will be discharged with a course of Flagyl and ibuprofen.  ER return precautions given.  She is hemodynamically stable in no acute distress.  Safer discharge home with outpatient follow-up as discussed.  Final Clinical Impression(s) / ED Diagnoses Final diagnoses:  Bacterial vaginosis  Dysmenorrhea    Rx / DC Orders ED Discharge Orders          Ordered    ibuprofen (ADVIL) 600 MG tablet  Every 6 hours PRN        10/21/20 0437    metroNIDAZOLE (FLAGYL) 500 MG tablet  2 times daily        10/21/20 0437             Barkley Boards, PA-C 10/21/20 0732    Zadie Rhine, MD 10/22/20 [phone]

## 2020-12-06 ENCOUNTER — Encounter: Payer: PRIVATE HEALTH INSURANCE | Admitting: Obstetrics and Gynecology

## 2021-01-19 ENCOUNTER — Encounter (HOSPITAL_COMMUNITY): Payer: Self-pay | Admitting: Oncology

## 2021-01-19 ENCOUNTER — Emergency Department (HOSPITAL_COMMUNITY): Payer: PRIVATE HEALTH INSURANCE

## 2021-01-19 ENCOUNTER — Emergency Department (HOSPITAL_COMMUNITY)
Admission: EM | Admit: 2021-01-19 | Discharge: 2021-01-19 | Disposition: A | Discharge status: Home / Self Care | Payer: PRIVATE HEALTH INSURANCE | Attending: Emergency Medicine | Admitting: Emergency Medicine

## 2021-01-19 ENCOUNTER — Other Ambulatory Visit: Payer: Self-pay

## 2021-01-19 DIAGNOSIS — Z8616 Personal history of COVID-19: Secondary | ICD-10-CM | POA: Insufficient documentation

## 2021-01-19 DIAGNOSIS — Z20822 Contact with and (suspected) exposure to covid-19: Secondary | ICD-10-CM | POA: Diagnosis not present

## 2021-01-19 DIAGNOSIS — J101 Influenza due to other identified influenza virus with other respiratory manifestations: Secondary | ICD-10-CM | POA: Insufficient documentation

## 2021-01-19 DIAGNOSIS — J111 Influenza due to unidentified influenza virus with other respiratory manifestations: Secondary | ICD-10-CM

## 2021-01-19 DIAGNOSIS — R61 Generalized hyperhidrosis: Secondary | ICD-10-CM | POA: Insufficient documentation

## 2021-01-19 DIAGNOSIS — R000 Tachycardia, unspecified: Secondary | ICD-10-CM | POA: Insufficient documentation

## 2021-01-19 DIAGNOSIS — R509 Fever, unspecified: Secondary | ICD-10-CM | POA: Diagnosis present

## 2021-01-19 LAB — RESP PANEL BY RT-PCR (FLU A&B, COVID) ARPGX2
Influenza A by PCR: POSITIVE — AB
Influenza B by PCR: NEGATIVE
SARS Coronavirus 2 by RT PCR: NEGATIVE

## 2021-01-19 MED ORDER — BENZONATATE 100 MG PO CAPS: 100.0000 mg | ORAL_CAPSULE | Freq: Three times a day (TID) | ORAL | 0 refills | Status: DC

## 2021-01-19 MED ORDER — OSELTAMIVIR PHOSPHATE 75 MG PO CAPS: 75.0000 mg | ORAL_CAPSULE | Freq: Two times a day (BID) | ORAL | 0 refills | Status: DC

## 2021-01-19 NOTE — ED Provider Notes (Signed)
[facility]-EMERGENCY DEPT Provider Note   CSN: [id number] Arrival date & time: 01/19/21  1532     History No chief complaint on file.   Madison Flowers is a 45 y.o. female with a past medical history of acid reflux and Planter fasciitis presenting today with a complaint of fever, cough, chills, body aches and all other URI symptoms.  The symptoms began yesterday.  Her son was recently diagnosed with the flu.  Denies any difficulty breathing or chest pain.  Has utilized Tylenol over-the-counter and this has helped her fever.  HPI     Past Medical History:  Diagnosis Date   Arthritis    Rt knee   Chronic pelvic pain in female    GERD (gastroesophageal reflux disease)    Plantar fasciitis     Patient Active Problem List   Diagnosis Date Noted   Gastroenteritis due to COVID-19 virus 11/23/2019   Morbidly obese (HCC) 11/23/2019   Plantar fasciitis of left foot 03/27/2014   Decreased libido 03/27/2014   Dyspareunia 12/12/2013   Patellar tendon strain 12/12/2013   Hemorrhoids 12/12/2013    Past Surgical History:  Procedure Laterality Date   KNEE ARTHROSCOPY WITH LATERAL MENISECTOMY Left 05/13/2019   Procedure: LEFT KNEE ARTHROSCOPY, CHONDROPLASTY, LATERAL MENISECTOMY;  Surgeon: Sheral Apley, MD;  Location: [facility];  Service: Orthopedics;  Laterality: Left;     OB History   No obstetric history on file.     Family History  Problem Relation Age of Onset   Cancer Mother        ovarian    Depression Mother    Diabetes Maternal Uncle    Heart disease Maternal Uncle    Heart disease Maternal Grandmother     Social History   Tobacco Use   Smoking status: Never   Smokeless tobacco: Never  Vaping Use   Vaping Use: Never used  Substance Use Topics   Alcohol use: Yes    Comment: occasional   Drug use: No    Home Medications Prior to Admission medications   Medication Sig Start Date End Date Taking? Authorizing Provider   oseltamivir (TAMIFLU) 75 MG capsule Take 1 capsule (75 mg total) by mouth every 12 (twelve) hours. 01/19/21  Yes Spurgeon Gancarz A, PA-C  acetaminophen (TYLENOL) 325 MG tablet Take 2 tablets (650 mg total) by mouth every 6 (six) hours as needed for mild pain or headache (fever >/= 101). 11/24/19   Lonia Blood, MD  ibuprofen (ADVIL) 600 MG tablet Take 1 tablet (600 mg total) by mouth every 6 (six) hours as needed. 10/21/20   McDonald, Mia A, PA-C  Ipratropium-Albuterol (COMBIVENT) 20-100 MCG/ACT AERS respimat Inhale 1 puff into the lungs every 6 (six) hours as needed for wheezing. 11/24/19   Lonia Blood, MD  metroNIDAZOLE (FLAGYL) 500 MG tablet Take 1 tablet (500 mg total) by mouth 2 (two) times daily. 10/21/20   McDonald, Mia A, PA-C    Allergies    Patient has no known allergies.  Review of Systems   Review of Systems  Constitutional:  Positive for chills and fever.  HENT:  Positive for congestion.   Respiratory:  Positive for shortness of breath.   Cardiovascular:  Positive for chest pain.  Gastrointestinal:  Positive for nausea and vomiting. Negative for diarrhea.  Neurological:  Positive for dizziness.  All other systems reviewed and are negative.  Physical Exam Updated Vital Signs BP (!) 149/88 (BP Location: Left Arm)   Pulse (!) 103  Temp 98.8 F (37.1 C) (Oral)   Resp 20   Ht 5' (1.524 m)   Wt 131.5 kg   LMP 01/07/2021 (Exact Date)   SpO2 95%   BMI 56.64 kg/m   Physical Exam Vitals and nursing note reviewed.  Constitutional:      General: She is not in acute distress.    Appearance: Normal appearance. She is diaphoretic.  HENT:     Head: Normocephalic and atraumatic.     Right Ear: Tympanic membrane normal.     Left Ear: Tympanic membrane normal.     Nose: Congestion present.     Mouth/Throat:     Mouth: Mucous membranes are moist.     Pharynx: Oropharynx is clear.  Eyes:     General: No scleral icterus.    Conjunctiva/sclera: Conjunctivae normal.   Cardiovascular:     Rate and Rhythm: Regular rhythm. Tachycardia present.  Pulmonary:     Effort: Pulmonary effort is normal. No respiratory distress.     Breath sounds: Normal breath sounds. No wheezing or rales.  Abdominal:     General: Abdomen is flat.     Palpations: Abdomen is soft.     Tenderness: There is no abdominal tenderness.  Lymphadenopathy:     Cervical: No cervical adenopathy.  Skin:    Findings: No rash.  Neurological:     Mental Status: She is alert.  Psychiatric:        Mood and Affect: Mood normal.    ED Results / Procedures / Treatments   Labs (all labs ordered are listed, but only abnormal results are displayed) Labs Reviewed  RESP PANEL BY RT-PCR (FLU A&B, COVID) [id number]    EKG None  Radiology DG Chest Portable 1 View  Result Date: 01/19/2021 CLINICAL DATA:  Shortness of breath, cough EXAM: PORTABLE CHEST 1 VIEW COMPARISON:  11/24/2019 FINDINGS: Transverse diameter of heart is increased. Central pulmonary vessels are prominent. There is poor inspiration. There is crowding of markings in the lower lung fields. There is no focal pulmonary consolidation. There are no signs of alveolar pulmonary edema. There is no significant pleural effusion or pneumothorax. IMPRESSION: Cardiomegaly. Central pulmonary vessels are more prominent. This may suggest CHF. Part of this finding may be due to poor inspiration. Increased markings in the lower lung fields may be due to crowding of normal bronchovascular structures due to poor inspiration or mild interstitial edema or interstitial pneumonitis. There is no focal pulmonary consolidation. There is no pleural effusion or pneumothorax. Electronically Signed   By: Ernie Avena M.D.   On: 01/19/2021 16:35    Procedures Procedures   Medications Ordered in ED Medications - No data to display  ED Course  I have reviewed the triage vital signs and the nursing notes.  Pertinent labs & imaging results that were available  during my care of the patient were reviewed by me and considered in my medical decision making (see chart for details).    MDM Rules/Calculators/A&P Patient was evaluated by me.  She was in no acute distress.  She was ill-appearing however vital signs were stable.  She had multiple episodes of productive cough with expiration.  I obtained a chest x-ray which was negative for pneumonia however called for potential congestive heart failure.  Patient has been notified of this and will follow up with a primary care provider.  At this time I suspect she has the flu like her children.  We discussed the pros and cons of Tamiflu, and she would  like the prescription.  I have sent Tamiflu as well as Jerilynn Som to her pharmacy.  At this time she is stable to be discharged home with over-the-counter treatment and strict return precautions.  All information shared in Spanish and discharge papers translated into Spanish as well.  Final Clinical Impression(s) / ED Diagnoses Final diagnoses:  Influenza    Rx / DC Orders ED Discharge Orders          Ordered    oseltamivir (TAMIFLU) 75 MG capsule  Every 12 hours        01/19/21 1645    benzonatate (TESSALON) 100 MG capsule  Every 8 hours        01/19/21 1709             Ryleah Miramontes A, PA-C 01/19/21 1732    Sloan Leiter, DO 01/21/21 0138

## 2021-01-19 NOTE — Discharge Instructions (Addendum)
Es posible que sus pruebas de COVID y gripe no regresen hoy.  Recibir una llamada si alguno de estos es positivo.  Tambin puede [employer] en su portal en lnea.  He enviado Tamiflu a su farmacia como se discuti.  Tambin he enviado un medicamento llamado benzonatato que debera ayudar con la tos.  La informacin sobre el fluido se adjunta a estos papeles de descarga.   Recuerde hacer un seguimiento con su proveedor de atencin primaria sobre su corazn.  Es posible que tenga insuficiencia cardaca.  La informacin sobre esto se adjunta a sus documentos de alta.  Fue Psychiatrist conocerte y espero que te sientas mejor.

## 2021-01-19 NOTE — ED Triage Notes (Signed)
Pt reports fever, chills, cough, fatigue and generalized body aches that began yesterday.  Son seen for the same Monday and dx w/ flu.  Pt has taken motrin at home for fever.

## 2021-03-02 IMAGING — DX DG CHEST 1V PORT
1 series · 1 of 1 positions shown · non-contrast
Comparison: 11/22/2019

CLINICAL DATA: Pneumonia due to COVID 19 virus

EXAM:
PORTABLE CHEST 1 VIEW

[chest ap]
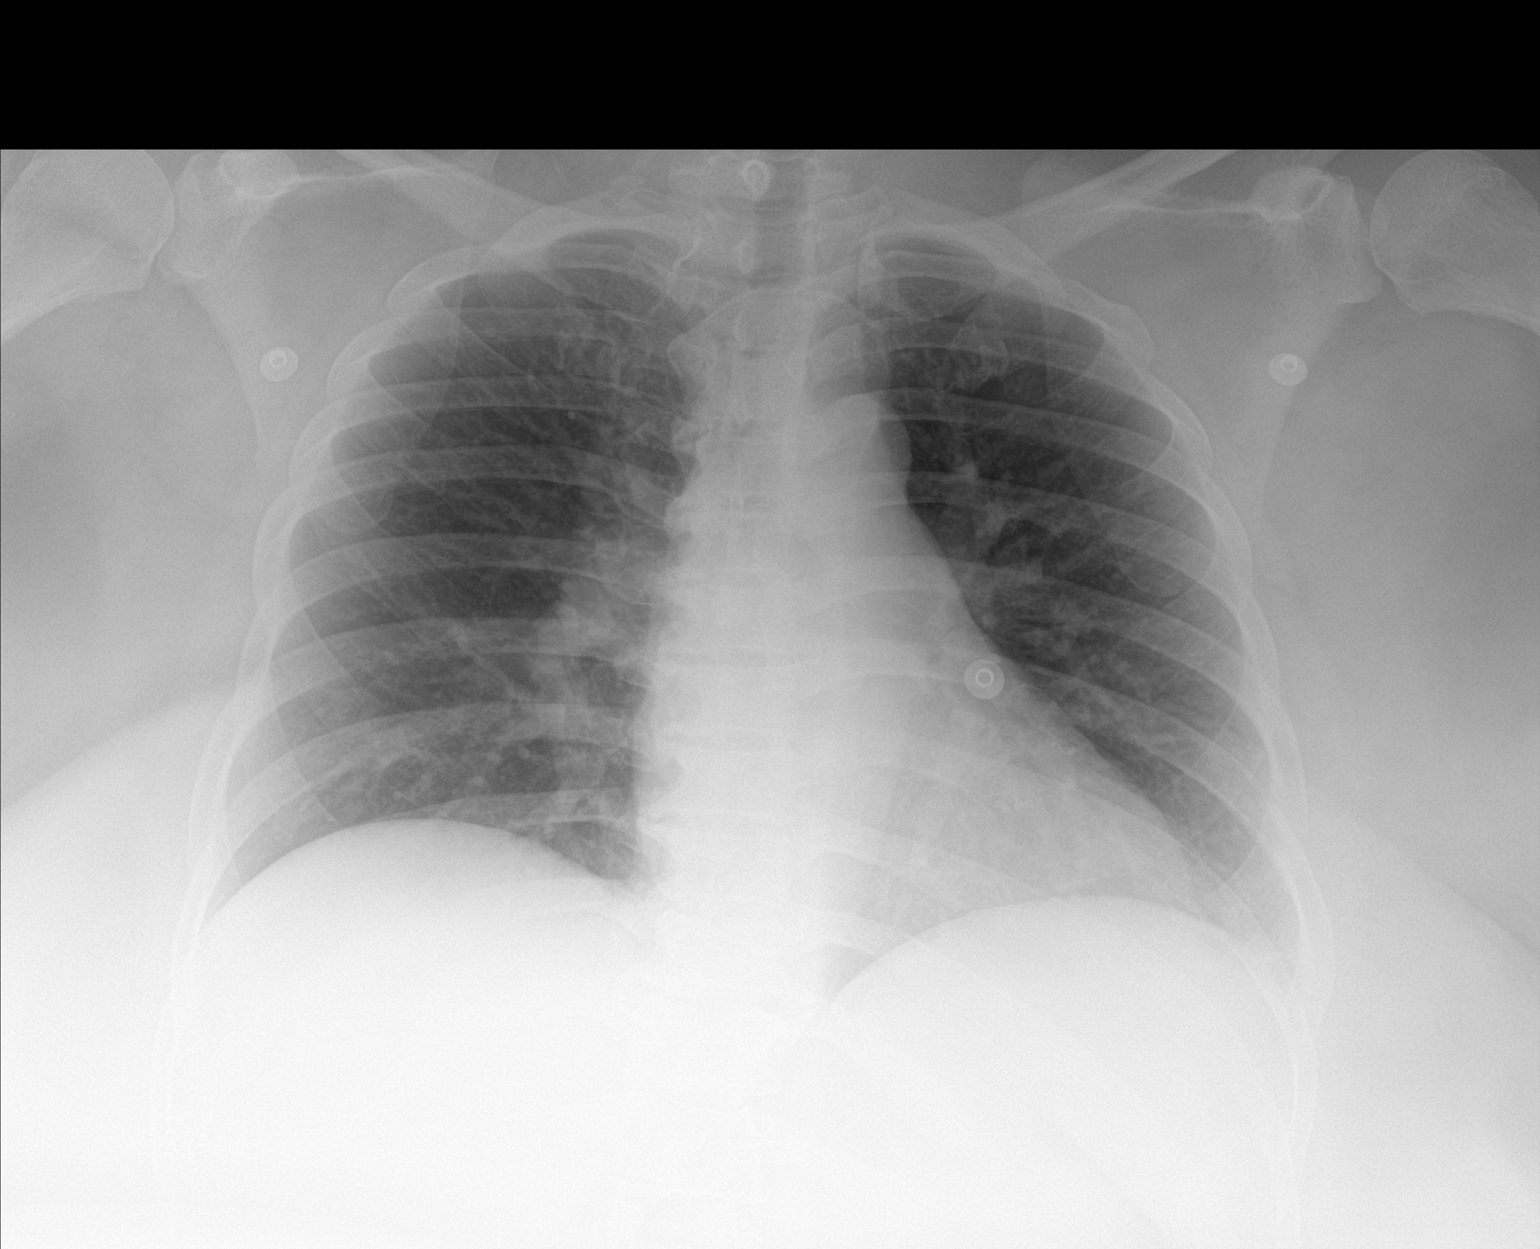

[1 of 1 positions shown; findings below may reference images not displayed]

FINDINGS: The heart size and mediastinal contours are within normal limits.
Both lungs are clear. The visualized skeletal structures are
unremarkable.
IMPRESSION: No active disease.

## 2021-12-04 ENCOUNTER — Encounter (HOSPITAL_COMMUNITY): Payer: Self-pay

## 2021-12-04 ENCOUNTER — Emergency Department (HOSPITAL_COMMUNITY)
Admission: EM | Admit: 2021-12-04 | Discharge: 2021-12-05 | Disposition: A | Discharge status: Home / Self Care | Payer: PRIVATE HEALTH INSURANCE | Attending: Emergency Medicine | Admitting: Emergency Medicine

## 2021-12-04 ENCOUNTER — Other Ambulatory Visit: Payer: Self-pay

## 2021-12-04 DIAGNOSIS — Z20822 Contact with and (suspected) exposure to covid-19: Secondary | ICD-10-CM | POA: Diagnosis not present

## 2021-12-04 DIAGNOSIS — R519 Headache, unspecified: Secondary | ICD-10-CM | POA: Insufficient documentation

## 2021-12-04 DIAGNOSIS — R509 Fever, unspecified: Secondary | ICD-10-CM | POA: Diagnosis not present

## 2021-12-04 DIAGNOSIS — R197 Diarrhea, unspecified: Secondary | ICD-10-CM | POA: Diagnosis not present

## 2021-12-04 DIAGNOSIS — R110 Nausea: Secondary | ICD-10-CM | POA: Insufficient documentation

## 2021-12-04 LAB — BASIC METABOLIC PANEL
Anion gap: 7 (ref 5–15)
BUN: 20 mg/dL (ref 6–20)
CO2: 23 mmol/L (ref 22–32)
Calcium: 9.2 mg/dL (ref 8.9–10.3)
Chloride: 110 mmol/L (ref 98–111)
Creatinine, Ser: 0.91 mg/dL (ref 0.44–1.00)
GFR, Estimated: >60 mL/min (ref >60)
Glucose, Bld: 113 mg/dL — ABNORMAL HIGH (ref 70–99)
Potassium: 3.9 mmol/L (ref 3.5–5.1)
Sodium: 140 mmol/L (ref 135–145)

## 2021-12-04 LAB — CBC WITH DIFFERENTIAL/PLATELET
Abs Immature Granulocytes: 0.01 10*3/uL (ref 0.00–0.07)
Basophils Absolute: 0.1 10*3/uL (ref 0.0–0.1)
Basophils Relative: 1 %
Eosinophils Absolute: 0.2 10*3/uL (ref 0.0–0.5)
Eosinophils Relative: 3 %
HCT: 39.9 % (ref 36.0–46.0)
Hemoglobin: 12.7 g/dL (ref 12.0–15.0)
Immature Granulocytes: 0 %
Lymphocytes Relative: 26 %
Lymphs Abs: 2.4 10*3/uL (ref 0.7–4.0)
MCH: 29.7 pg (ref 26.0–34.0)
MCHC: 31.8 g/dL (ref 30.0–36.0)
MCV: 93.2 fL (ref 80.0–100.0)
Monocytes Absolute: 0.7 10*3/uL (ref 0.1–1.0)
Monocytes Relative: 8 %
Neutro Abs: 5.6 10*3/uL (ref 1.7–7.7)
Neutrophils Relative %: 62 %
Platelets: 310 10*3/uL (ref 150–400)
RBC: 4.28 MIL/uL (ref 3.87–5.11)
RDW: 12.2 % (ref 11.5–15.5)
WBC: 9 10*3/uL (ref 4.0–10.5)
nRBC: 0 % (ref 0.0–0.2)

## 2021-12-04 LAB — RESP PANEL BY RT-PCR (FLU A&B, COVID) ARPGX2
Influenza A by PCR: NEGATIVE
Influenza B by PCR: NEGATIVE
SARS Coronavirus 2 by RT PCR: NEGATIVE

## 2021-12-04 NOTE — ED Provider Triage Note (Addendum)
Emergency Medicine Provider Triage Evaluation Note  Madison Flowers , a 46 y.o. female  was evaluated in triage.  Pt complains of subjective fevers, headache, nausea.  Symptoms started this morning.  She also has body aches.  Describes headache as a headband distribution.  Also reports photophobia.  Review of Systems  Positive: As above Negative: Abdominal pain, vomiting, chest pain, shortness of breath  Physical Exam  BP (!) 145/93   Pulse 76   Temp 98.4 F (36.9 C)   Resp (!) 24   Ht 5\' 1"  (1.549 m)   Wt 135.2 kg   SpO2 97%   BMI 56.31 kg/m  Gen:   Awake, no distress   Resp:  Normal effort  MSK:   Moves extremities without difficulty  Other:    Medical Decision Making  Medically screening exam initiated at 10:22 PM.  Appropriate orders placed.  Madison Flowers was informed that the remainder of the evaluation will be completed by another provider, this initial triage assessment does not replace that evaluation, and the importance of remaining in the ED until their evaluation is complete.  Respiratory panel, basic labs   Madison Flowers 12/04/21 2223    Roylene Reason, PA-C 12/04/21 2233

## 2021-12-04 NOTE — ED Triage Notes (Signed)
Pt reports with headache (with dizziness), fever, and diarrhea x 2 days.

## 2021-12-05 MED ORDER — LACTATED RINGERS IV BOLUS
1000.0000 mL | Freq: Once | INTRAVENOUS | Status: AC
  Administered 2021-12-05: 1000 mL via INTRAVENOUS

## 2021-12-05 MED ORDER — DIPHENHYDRAMINE HCL 50 MG/ML IJ SOLN
25.0000 mg | Freq: Once | INTRAMUSCULAR | Status: AC
  Administered 2021-12-05: 25 mg via INTRAVENOUS
  Filled 2021-12-05: qty 1

## 2021-12-05 MED ORDER — KETOROLAC TROMETHAMINE 30 MG/ML IJ SOLN
30.0000 mg | Freq: Once | INTRAMUSCULAR | Status: AC
  Administered 2021-12-05: 30 mg via INTRAVENOUS
  Filled 2021-12-05: qty 1

## 2021-12-05 MED ORDER — METOCLOPRAMIDE HCL 5 MG/ML IJ SOLN
10.0000 mg | Freq: Once | INTRAMUSCULAR | Status: AC
  Administered 2021-12-05: 10 mg via INTRAVENOUS
  Filled 2021-12-05: qty 2

## 2021-12-05 NOTE — ED Notes (Signed)
Pt ambulatory to restroom w/out assistance.  

## 2021-12-05 NOTE — ED Provider Notes (Signed)
[facility] Emergency Department Provider Note MRN:  [id number]  Arrival date & time: 12/05/21     Chief Complaint   Headache, Fever, and Diarrhea   History of Present Illness   Madison Flowers is a 46 y.o. year-old female presents to the ED with chief complaint of headache.  She states that she has had a headache for the past 3 days.  She reports associated nausea and diarrhea.  She is uncertain whether she had a fever, but states that she felt warm.  She did not measure her temperature.  She has taken Tylenol with some relief of her symptoms.  She denies numbness, weakness, or tingling.  Denies any slurred speech or vision changes..  She denies abdominal pain, or dysuria.  History provided by patient.   Review of Systems  Pertinent positive and negative review of systems noted in HPI.    Physical Exam   Vitals:   12/05/21 0330 12/05/21 0430  BP: 131/76 (!) 138/93  Pulse: 77 76  Resp: 18 18  Temp:    SpO2: 100% 99%    CONSTITUTIONAL:  well-appearing, NAD NEURO:  Alert and oriented x 3, CN 3-12 grossly intact, movements are goal oriented EYES:  eyes equal and reactive ENT/NECK:  Supple, no stridor  CARDIO:  normal rate, regular rhythm, appears well-perfused  PULM:  No respiratory distress, CTA B GI/GU:  non-distended,  MSK/SPINE:  No gross deformities, no edema, moves all extremities  SKIN:  no rash, atraumatic   *Additional and/or pertinent findings included in MDM below  Diagnostic and Interventional Summary    EKG Interpretation  Date/Time:  Sunday December 04 2021 22:01:02 EDT Ventricular Rate:  79 PR Interval:  149 QRS Duration: 90 QT Interval:  383 QTC Calculation: 439 R Axis:   -2 Text Interpretation: Sinus rhythm Confirmed by Tilden Fossa [phone]) on 12/05/2021 3:40:07 AM       Labs Reviewed  BASIC METABOLIC PANEL - Abnormal; Notable for the following components:      Result Value   Glucose, Bld 113 (*)    All  other components within normal limits  RESP PANEL BY RT-PCR (FLU A&B, COVID) ARPGX2  CBC WITH DIFFERENTIAL/PLATELET    No orders to display    Medications  lactated ringers bolus 1,000 mL (0 mLs Intravenous Stopped 12/05/21 0423)  ketorolac (TORADOL) 30 MG/ML injection 30 mg (30 mg Intravenous Given 12/05/21 0420)  diphenhydrAMINE (BENADRYL) injection 25 mg (25 mg Intravenous Given 12/05/21 0419)  metoCLOPramide (REGLAN) injection 10 mg (10 mg Intravenous Given 12/05/21 0422)     Procedures  /  Critical Care Procedures  ED Course and Medical Decision Making  I have reviewed the triage vital signs, the nursing notes, and pertinent available records from the EMR.  Social Determinants Affecting Complexity of Care: Patient has no clinically significant social determinants affecting this chief complaint..   ED Course:    Medical Decision Making Patient here with headache for the past 2 days with associated subjective fevers, nausea, and diarrhea.  Vitals are normal.  She is afebrile.  She never actually measured her temperature, question whether the fevers were legitimate or not.  She does not look toxic.  Her abdomen is soft and nontender.  I doubt surgical or acute abdomen.  Patient given headache cocktail with good improvement.  She states that she feels much better now.  She was noted to be fairly hypertensive, headache could be secondary to hypertension.  Also consider viral etiology given her nausea and  diarrhea.  She has a normal neurologic exam, at this point, I do not think that she requires any advanced imaging or further work-up tonight.  I have advised that she follow-up with her PCP.  She is agreeable with this plan.  Amount and/or Complexity of Data Reviewed Labs: ordered.    Details: Normal electrolytes, no leukocytosis, COVID test negative  Risk Prescription drug management.     Consultants: No consultations were needed in caring for this patient.   Treatment  and Plan: Emergency department workup does not suggest an emergent condition requiring admission or immediate intervention beyond  what has been performed at this time. The patient is safe for discharge and has  been instructed to return immediately for worsening symptoms, change in  symptoms or any other concerns    Final Clinical Impressions(s) / ED Diagnoses     ICD-10-CM   1. Nonintractable headache, unspecified chronicity pattern, unspecified headache type  R51.9     2. Diarrhea, unspecified type  R19.7       ED Discharge Orders     None         Discharge Instructions Discussed with and Provided to Patient:   Discharge Instructions   None      Montine Circle, PA-C 12/05/21 0444    Quintella Reichert, MD 12/05/21 [phone]

## 2022-02-14 ENCOUNTER — Encounter (HOSPITAL_COMMUNITY): Payer: Self-pay

## 2022-02-14 ENCOUNTER — Ambulatory Visit (HOSPITAL_COMMUNITY)
Admission: EM | Admit: 2022-02-14 | Discharge: 2022-02-14 | Disposition: A | Discharge status: Home / Self Care | Payer: PRIVATE HEALTH INSURANCE | Attending: Internal Medicine | Admitting: Internal Medicine

## 2022-02-14 DIAGNOSIS — M7522 Bicipital tendinitis, left shoulder: Secondary | ICD-10-CM

## 2022-02-14 DIAGNOSIS — M1711 Unilateral primary osteoarthritis, right knee: Secondary | ICD-10-CM

## 2022-02-14 MED ORDER — METHYLPREDNISOLONE SODIUM SUCC 125 MG IJ SOLR
60.0000 mg | Freq: Once | INTRAMUSCULAR | Status: AC
  Administered 2022-02-14: 60 mg via INTRAMUSCULAR

## 2022-02-14 MED ORDER — METHYLPREDNISOLONE SODIUM SUCC 125 MG IJ SOLR
INTRAMUSCULAR | Status: AC
  Filled 2022-02-14: qty 2

## 2022-02-14 NOTE — Discharge Instructions (Signed)
Follow up with orthopedics if today's shot does not help.

## 2022-02-14 NOTE — ED Triage Notes (Signed)
Chief Complaint: pain and swelling in both knees, worse in the right knee. States had knee surgery on the left a year ago. States unable to bear weight. Patient states she was also diagnosed with arthritis. No recent falls or heavy lifting. Patient states she has been working more and standing for long hours.   Onset: 1 week   OTC medications tried: Yes- Voltaren gel    with little relief

## 2022-02-14 NOTE — ED Provider Notes (Signed)
[facility]    CSN: [id number] Arrival date & time: 02/14/22  1257      History   Chief Complaint Chief Complaint  Patient presents with   Joint Pain    Knees, and shoulder     HPI Madison Flowers is a 46 y.o. female presents with bilateral knee swelling and increased pain in the R x 1 week. States she has been standing for prolonged hours at work and working 1-2 more hours per shift.She has also been doing a lot of repetitive lifting above shoulders.  Has hx of OA in both knees for which she will need total knee replacement. The voltaren gel has not helped. Took Ibuprofen 600 mg today and has not helped. She missed work yesterday and today due to the pain and unable to bare weight. She denies an acute injury.      Patient Active Problem List   Diagnosis Date Noted   Gastroenteritis due to COVID-19 virus 11/23/2019   Morbidly obese (HCC) 11/23/2019   Plantar fasciitis of left foot 03/27/2014   Decreased libido 03/27/2014   Dyspareunia 12/12/2013   Patellar tendon strain 12/12/2013   Hemorrhoids 12/12/2013    Past Surgical History:  Procedure Laterality Date   KNEE ARTHROSCOPY WITH LATERAL MENISECTOMY Left 05/13/2019   Procedure: LEFT KNEE ARTHROSCOPY, CHONDROPLASTY, LATERAL MENISECTOMY;  Surgeon: Sheral Apley, MD;  Location: [facility];  Service: Orthopedics;  Laterality: Left;    OB History   No obstetric history on file.      Home Medications    Prior to Admission medications   Medication Sig Start Date End Date Taking? Authorizing Provider  ibuprofen (ADVIL) 600 MG tablet Take 1 tablet (600 mg total) by mouth every 6 (six) hours as needed. 10/21/20  Yes McDonald, Mia A, PA-C  acetaminophen (TYLENOL) 325 MG tablet Take 2 tablets (650 mg total) by mouth every 6 (six) hours as needed for mild pain or headache (fever >/= 101). 11/24/19   Lonia Blood, MD  Ipratropium-Albuterol (COMBIVENT) 20-100 MCG/ACT AERS respimat Inhale 1 puff into the  lungs every 6 (six) hours as needed for wheezing. 11/24/19   Lonia Blood, MD    Family History Family History  Problem Relation Age of Onset   Cancer Mother        ovarian    Depression Mother    Diabetes Maternal Uncle    Heart disease Maternal Uncle    Heart disease Maternal Grandmother     Social History Social History   Tobacco Use   Smoking status: Never   Smokeless tobacco: Never  Vaping Use   Vaping Use: Never used  Substance Use Topics   Alcohol use: Yes    Comment: occasional   Drug use: No     Allergies   Patient has no known allergies.   Review of Systems Review of Systems  Musculoskeletal:  Positive for arthralgias, gait problem and joint swelling. Negative for neck pain and neck stiffness.  Skin:  Negative for color change, pallor, rash and wound.  Neurological:  Negative for numbness.     Physical Exam Triage Vital Signs ED Triage Vitals  Enc Vitals Group     BP 02/14/22 1414 (!) 161/87     Pulse Rate 02/14/22 1414 77     Resp 02/14/22 1414 16     Temp 02/14/22 1414 98.4 F (36.9 C)     Temp Source 02/14/22 1414 Oral     SpO2 02/14/22 1414 100 %  Weight --      Height --      Head Circumference --      Peak Flow --      Pain Score 02/14/22 1412 8     Pain Loc --      Pain Edu? --      Excl. in GC? --    No data found.  Updated Vital Signs BP (!) 161/87 (BP Location: Right Wrist)   Pulse 77   Temp 98.4 F (36.9 C) (Oral)   Resp 16   LMP 02/04/2022 (Approximate)   SpO2 100%   Visual Acuity Right Eye Distance:   Left Eye Distance:   Bilateral Distance:    Right Eye Near:   Left Eye Near:    Bilateral Near:     Physical Exam Vitals and nursing note reviewed.  Constitutional:      Appearance: She is obese.     Comments: Tearful and in pain  HENT:     Right Ear: External ear normal.     Left Ear: External ear normal.  Eyes:     General: No scleral icterus.    Conjunctiva/sclera: Conjunctivae normal.   Pulmonary:     Effort: Pulmonary effort is normal.  Musculoskeletal:        General: No deformity or signs of injury.     Cervical back: Neck supple.     Right lower leg: No edema.     Left lower leg: No edema.     Comments: L knee- with mild swelling, no warmth  and mild pain in medial knee joint region R KNEE- with mild swelling, severe tenderness on medial knee joint region, ROM is decreased due to pain.  L Shoulder- has very tender bicep tendon area with limited ROM due to the pain, but strength is 5/5  Skin:    General: Skin is warm and dry.  Neurological:     Mental Status: She is alert and oriented to person, place, and time.     Gait: Gait normal.     Deep Tendon Reflexes: Reflexes normal.  Psychiatric:        Mood and Affect: Mood normal.        Behavior: Behavior normal.        Thought Content: Thought content normal.        Judgment: Judgment normal.      UC Treatments / Results  Labs (all labs ordered are listed, but only abnormal results are displayed) Labs Reviewed - No data to display  EKG   Radiology No results found.  Procedures Procedures (including critical care time)  Medications Ordered in UC Medications  methylPREDNISolone sodium succinate (SOLU-MEDROL) 125 mg/2 mL injection 60 mg (60 mg Intramuscular Given 02/14/22 1552)    Initial Impression / Assessment and Plan / UC Course  I have reviewed the triage vital signs and the nursing notes.  L bicep tendonitis Bilateral knee OA worse pain on R  She was given solumedrol 60 mg IM which she has responded well in the past when her PCP gave it to her a few years ago.   Final Clinical Impressions(s) / UC Diagnoses  L bicep tendonitis Bilateral knee OA worse pain on R Final diagnoses:  Biceps tendonitis on left  Primary osteoarthritis of right knee     Discharge Instructions      Follow up with orthopedics if today's shot does not help.      ED Prescriptions   None    PDMP  not  reviewed this encounter.   Garey Ham, [place] 02/14/22 1557

## 2022-12-14 ENCOUNTER — Emergency Department (HOSPITAL_COMMUNITY): Payer: Medicaid Other

## 2022-12-14 ENCOUNTER — Emergency Department (HOSPITAL_COMMUNITY)
Admission: EM | Admit: 2022-12-14 | Discharge: 2022-12-14 | Disposition: A | Discharge status: Home / Self Care | Payer: Medicaid Other | Attending: Emergency Medicine | Admitting: Emergency Medicine

## 2022-12-14 ENCOUNTER — Other Ambulatory Visit: Payer: Self-pay

## 2022-12-14 DIAGNOSIS — M546 Pain in thoracic spine: Secondary | ICD-10-CM | POA: Diagnosis not present

## 2022-12-14 DIAGNOSIS — R0789 Other chest pain: Secondary | ICD-10-CM | POA: Diagnosis present

## 2022-12-14 DIAGNOSIS — M549 Dorsalgia, unspecified: Secondary | ICD-10-CM

## 2022-12-14 LAB — CBC
HCT: 39.4 % (ref 36.0–46.0)
Hemoglobin: 12.8 g/dL (ref 12.0–15.0)
MCH: 29.4 pg (ref 26.0–34.0)
MCHC: 32.5 g/dL (ref 30.0–36.0)
MCV: 90.4 fL (ref 80.0–100.0)
Platelets: 341 10*3/uL (ref 150–400)
RBC: 4.36 MIL/uL (ref 3.87–5.11)
RDW: 11.6 % (ref 11.5–15.5)
WBC: 11.3 10*3/uL — ABNORMAL HIGH (ref 4.0–10.5)
nRBC: 0 % (ref 0.0–0.2)

## 2022-12-14 LAB — BASIC METABOLIC PANEL
Anion gap: 8 (ref 5–15)
BUN: 11 mg/dL (ref 6–20)
CO2: 25 mmol/L (ref 22–32)
Calcium: 8.5 mg/dL — ABNORMAL LOW (ref 8.9–10.3)
Chloride: 102 mmol/L (ref 98–111)
Creatinine, Ser: 0.63 mg/dL (ref 0.44–1.00)
GFR, Estimated: >60 mL/min (ref >60)
Glucose, Bld: 112 mg/dL — ABNORMAL HIGH (ref 70–99)
Potassium: 3.2 mmol/L — ABNORMAL LOW (ref 3.5–5.1)
Sodium: 135 mmol/L (ref 135–145)

## 2022-12-14 LAB — PROTIME-INR
INR: 1 (ref 0.8–1.2)
Prothrombin Time: 13.8 s (ref 11.4–15.2)

## 2022-12-14 LAB — HCG, SERUM, QUALITATIVE: Preg, Serum: NEGATIVE

## 2022-12-14 LAB — TROPONIN I (HIGH SENSITIVITY)
Troponin I (High Sensitivity): 23 ng/L — ABNORMAL HIGH (ref <18)
Troponin I (High Sensitivity): 20 ng/L — ABNORMAL HIGH (ref <18)

## 2022-12-14 MED ORDER — KETOROLAC TROMETHAMINE 30 MG/ML IJ SOLN
15.0000 mg | Freq: Once | INTRAMUSCULAR | Status: AC
  Administered 2022-12-14: 15 mg via INTRAVENOUS
  Filled 2022-12-14: qty 1

## 2022-12-14 MED ORDER — ASPIRIN 81 MG PO CHEW
324.0000 mg | CHEWABLE_TABLET | Freq: Once | ORAL | Status: AC
  Administered 2022-12-14: 324 mg via ORAL
  Filled 2022-12-14: qty 4

## 2022-12-14 NOTE — Discharge Instructions (Addendum)
You were seen in the emergency room for chest pain and back pain Your EKG, blood work and chest x-ray all looked okay Your pain improved after aspirin and a medication called Toradol here We are not certain what is causing your chest pain and back pain but it may be related to a muscle strain Take Tylenol and/or Motrin as directed for discomfort Follow-up with your primary care doctor with 1 week for reevaluation Return to the emergency department for severe chest pain, trouble breathing or any other concerns

## 2022-12-14 NOTE — ED Triage Notes (Signed)
Patient reports central chest pain with SOB this evening , no emesis or diaphoresis , upper back pain . No cough or fever .

## 2022-12-14 NOTE — ED Provider Notes (Signed)
[place] EMERGENCY DEPARTMENT AT [facility] Provider Note   CSN: [id number] Arrival date & time: 12/14/22  1945     History  Chief Complaint  Patient presents with   Chest Pain    Madison Flowers is a 47 y.o. female.  With a history of obesity who presents to the ED for chest pain.  She first experienced chest pain while at work tonight around 1830 but has persisted since the onset.  Pain localized over substernal region with radiation through to her back.  Some associated shortness of breath.  Denies nausea, vomiting, diaphoresis, fevers, chills and recent illness.  No prior history of MI.  No drug and alcohol use.  Positive family history of cardiac disease before the age of 82   Chest Pain Associated symptoms: back pain and shortness of breath        Home Medications Prior to Admission medications   Medication Sig Start Date End Date Taking? Authorizing Provider  acetaminophen (TYLENOL) 325 MG tablet Take 2 tablets (650 mg total) by mouth every 6 (six) hours as needed for mild pain or headache (fever >/= 101). 11/24/19   Lonia Blood, MD  ibuprofen (ADVIL) 600 MG tablet Take 1 tablet (600 mg total) by mouth every 6 (six) hours as needed. 10/21/20   McDonald, Mia A, PA-C  Ipratropium-Albuterol (COMBIVENT) 20-100 MCG/ACT AERS respimat Inhale 1 puff into the lungs every 6 (six) hours as needed for wheezing. 11/24/19   Lonia Blood, MD      Allergies    Patient has no known allergies.    Review of Systems   Review of Systems  Respiratory:  Positive for shortness of breath.   Cardiovascular:  Positive for chest pain.  Musculoskeletal:  Positive for back pain.  All other systems reviewed and are negative.   Physical Exam Updated Vital Signs BP (!) 147/93   Pulse 80   Temp 97.8 F (36.6 C) (Oral)   Resp 12   LMP 12/01/2022   SpO2 100%  Physical Exam Vitals and nursing note reviewed.  HENT:     Head: Normocephalic and atraumatic.  Eyes:      Pupils: Pupils are equal, round, and reactive to light.  Cardiovascular:     Rate and Rhythm: Normal rate and regular rhythm.  Pulmonary:     Effort: Pulmonary effort is normal.     Breath sounds: Normal breath sounds.  Chest:     Chest wall: Tenderness present.     Comments: Reproducible pain with moderate palpation of substernal region Abdominal:     Palpations: Abdomen is soft.     Tenderness: There is no abdominal tenderness.  Musculoskeletal:     Comments: Paraspinal upper thoracic tenderness bilaterally which reproduces back pain No midline tenderness step-off deformity  Skin:    General: Skin is warm and dry.  Neurological:     Mental Status: She is alert.  Psychiatric:        Mood and Affect: Mood normal.     ED Results / Procedures / Treatments   Labs (all labs ordered are listed, but only abnormal results are displayed) Labs Reviewed  BASIC METABOLIC PANEL - Abnormal; Notable for the following components:      Result Value   Potassium 3.2 (*)    Glucose, Bld 112 (*)    Calcium 8.5 (*)    All other components within normal limits  CBC - Abnormal; Notable for the following components:   WBC 11.3 (*)  All other components within normal limits  TROPONIN I (HIGH SENSITIVITY) - Abnormal; Notable for the following components:   Troponin I (High Sensitivity) 23 (*)    All other components within normal limits  TROPONIN I (HIGH SENSITIVITY) - Abnormal; Notable for the following components:   Troponin I (High Sensitivity) 20 (*)    All other components within normal limits  HCG, SERUM, QUALITATIVE  PROTIME-INR    EKG Normal sinus rhythm 81 bpm No ST elevation QTc 414 None  Radiology DG Chest 2 View  Result Date: 12/14/2022 CLINICAL DATA:  Central chest pain.  Shortness of breath EXAM: CHEST - 2 VIEW COMPARISON:  Chest radiograph dated 01/19/2021 FINDINGS: Normal lung volumes. Mild bilateral interstitial opacities. No pleural effusion or pneumothorax. The  heart size and mediastinal contours are within normal limits. No acute osseous abnormality. IMPRESSION: Mild bilateral interstitial opacities, which may represent pulmonary edema. Electronically Signed   By: Agustin Cree M.D.   On: 12/14/2022 20:39    Procedures Procedures    Medications Ordered in ED Medications  ketorolac (TORADOL) 30 MG/ML injection 15 mg (15 mg Intravenous Given 12/14/22 2315)  aspirin chewable tablet 324 mg (324 mg Oral Given 12/14/22 2313)    ED Course/ Medical Decision Making/ A&P                                 Medical Decision Making 47 year old female with history as obesity presenting for chest pain and back pain beginning tonight.  Afebrile and slightly hypertensive here.  Appears to be in some discomfort.  Exam notable for reproducible chest and back pain with palpation.  Differential diagnosis includes  ACS.  Will obtain high-sensitivity troponin and EKG Pneumonia.  Will obtain chest x-ray Musculoskeletal strain/sprain.  Pain is reproducible on exam.  Diagnosis of exclusion Low suspicion for aortic injury or or dissection based on relatively young age, history and clinical exam  Amount and/or Complexity of Data Reviewed Labs: ordered. Radiology: ordered.  Risk OTC drugs. Prescription drug management.           Final Clinical Impression(s) / ED Diagnoses Final diagnoses:  Chest wall pain  Acute back pain, unspecified back location, unspecified back pain laterality    Rx / DC Orders ED Discharge Orders     None         Royanne Foots, DO 12/14/22 2342

## 2023-07-10 ENCOUNTER — Other Ambulatory Visit: Payer: Self-pay

## 2023-07-10 ENCOUNTER — Emergency Department (HOSPITAL_COMMUNITY)
Admission: EM | Admit: 2023-07-10 | Discharge: 2023-07-11 | Disposition: A | Discharge status: Home / Self Care | Attending: Emergency Medicine | Admitting: Emergency Medicine

## 2023-07-10 ENCOUNTER — Emergency Department (HOSPITAL_COMMUNITY)

## 2023-07-10 ENCOUNTER — Encounter (HOSPITAL_COMMUNITY): Payer: Self-pay | Admitting: Emergency Medicine

## 2023-07-10 DIAGNOSIS — R600 Localized edema: Secondary | ICD-10-CM | POA: Diagnosis not present

## 2023-07-10 DIAGNOSIS — M79622 Pain in left upper arm: Secondary | ICD-10-CM | POA: Diagnosis present

## 2023-07-10 DIAGNOSIS — D72829 Elevated white blood cell count, unspecified: Secondary | ICD-10-CM | POA: Diagnosis not present

## 2023-07-10 DIAGNOSIS — R0602 Shortness of breath: Secondary | ICD-10-CM | POA: Insufficient documentation

## 2023-07-10 DIAGNOSIS — M79606 Pain in leg, unspecified: Secondary | ICD-10-CM | POA: Diagnosis not present

## 2023-07-10 DIAGNOSIS — R079 Chest pain, unspecified: Secondary | ICD-10-CM | POA: Insufficient documentation

## 2023-07-10 LAB — CBC
HCT: 36 % (ref 36.0–46.0)
Hemoglobin: 11.4 g/dL — ABNORMAL LOW (ref 12.0–15.0)
MCH: 29.8 pg (ref 26.0–34.0)
MCHC: 31.7 g/dL (ref 30.0–36.0)
MCV: 94 fL (ref 80.0–100.0)
Platelets: 339 10*3/uL (ref 150–400)
RBC: 3.83 MIL/uL — ABNORMAL LOW (ref 3.87–5.11)
RDW: 12.5 % (ref 11.5–15.5)
WBC: 15.4 10*3/uL — ABNORMAL HIGH (ref 4.0–10.5)
nRBC: 0 % (ref 0.0–0.2)

## 2023-07-10 LAB — D-DIMER, QUANTITATIVE: D-Dimer, Quant: 0.97 ug{FEU}/mL — ABNORMAL HIGH (ref 0.00–0.50)

## 2023-07-10 LAB — BASIC METABOLIC PANEL WITH GFR
Anion gap: 8 (ref 5–15)
BUN: 18 mg/dL (ref 6–20)
CO2: 26 mmol/L (ref 22–32)
Calcium: 8.8 mg/dL — ABNORMAL LOW (ref 8.9–10.3)
Chloride: 104 mmol/L (ref 98–111)
Creatinine, Ser: 0.63 mg/dL (ref 0.44–1.00)
GFR, Estimated: >60 mL/min (ref >60)
Glucose, Bld: 129 mg/dL — ABNORMAL HIGH (ref 70–99)
Potassium: 3.7 mmol/L (ref 3.5–5.1)
Sodium: 138 mmol/L (ref 135–145)

## 2023-07-10 LAB — HCG, SERUM, QUALITATIVE: Preg, Serum: NEGATIVE

## 2023-07-10 LAB — TROPONIN I (HIGH SENSITIVITY)
Troponin I (High Sensitivity): 15 ng/L (ref <18)
Troponin I (High Sensitivity): 18 ng/L — ABNORMAL HIGH (ref <18)

## 2023-07-10 MED ORDER — FUROSEMIDE 10 MG/ML IJ SOLN
20.0000 mg | Freq: Once | INTRAMUSCULAR | Status: AC
  Administered 2023-07-11: 20 mg via INTRAVENOUS
  Filled 2023-07-10: qty 2

## 2023-07-10 MED ORDER — IOHEXOL 350 MG/ML SOLN
75.0000 mL | Freq: Once | INTRAVENOUS | Status: AC | PRN
  Administered 2023-07-10: 75 mL via INTRAVENOUS

## 2023-07-10 MED ORDER — MORPHINE SULFATE (PF) 4 MG/ML IV SOLN
4.0000 mg | Freq: Once | INTRAVENOUS | Status: AC
  Administered 2023-07-10: 4 mg via INTRAVENOUS
  Filled 2023-07-10: qty 1

## 2023-07-10 MED ORDER — ONDANSETRON HCL 4 MG/2ML IJ SOLN: 4.0000 mg | INTRAMUSCULAR | Status: DC | PRN

## 2023-07-10 NOTE — ED Notes (Signed)
 IV team at bedside

## 2023-07-10 NOTE — ED Provider Notes (Signed)
 [facility] Provider Note   CSN: [id number] Arrival date & time: 07/10/23  1602     History  Chief Complaint  Patient presents with   Claudication   Chest Pain   Arm Pain    Lincoln Shew is a 48 y.o. female with no noted pertinent past medical history presents emergency department for evaluation of pain and paresthesia in the left upper extremity that radiates into the left side of her head.  Headache started today. Gradually worsened. No visual disturbances, recent trauma, thinners, nor hx of migraines.  She also complains of chest pain, shortness of breath over the past 2 days.  She endorses shortness of breath at rest that worsens with exertion.  Chest pain is described as pressure on the left side that radiates into the back.  She also complains of bilateral lower extremity cramping that started while she was in [place] last week.  She went to the emergency department in [place] and was provided medication for hypertension and pain.  She is not aware of what medication she was provided for hypertension.  She was provided Lasix  for diuresis. She does not know the dose.  She returned from [place] 2 days ago.   Chest Pain Arm Pain Associated symptoms include chest pain.      Home Medications Prior to Admission medications   Medication Sig Start Date End Date Taking? Authorizing Provider  acetaminophen  (TYLENOL ) 325 MG tablet Take 2 tablets (650 mg total) by mouth every 6 (six) hours as needed for mild pain or headache (fever >/= 101). 11/24/19   Abbe Abate, MD  ibuprofen  (ADVIL ) 600 MG tablet Take 1 tablet (600 mg total) by mouth every 6 (six) hours as needed. 10/21/20   McDonald, Mia A, PA-C  Ipratropium-Albuterol  (COMBIVENT ) 20-100 MCG/ACT AERS respimat Inhale 1 puff into the lungs every 6 (six) hours as needed for wheezing. 11/24/19   Abbe Abate, MD      Allergies    Patient has no known allergies.     Review of Systems   Review of Systems  Cardiovascular:  Positive for chest pain.    Physical Exam Updated Vital Signs BP (!) 149/89   Pulse 82   Temp 97.7 F (36.5 C)   Resp 15   Wt 135.2 kg   LMP 07/10/2023 (Exact Date)   SpO2 94%   BMI 56.32 kg/m  Physical Exam Vitals and nursing note reviewed.  Constitutional:      General: She is not in acute distress.    Appearance: Normal appearance. She is not ill-appearing.  HENT:     Head: Normocephalic and atraumatic.  Eyes:     Conjunctiva/sclera: Conjunctivae normal.  Cardiovascular:     Rate and Rhythm: Normal rate.     Pulses:          Radial pulses are 2+ on the right side and 2+ on the left side.       Dorsalis pedis pulses are 2+ on the right side and 2+ on the left side.  Pulmonary:     Effort: Pulmonary effort is normal. No respiratory distress.     Breath sounds: Normal breath sounds.  Chest:     Chest wall: No tenderness.  Abdominal:     General: Bowel sounds are normal. There is no distension.     Palpations: Abdomen is soft.     Tenderness: There is no abdominal tenderness. There is no guarding.  Musculoskeletal:  Right lower leg: 1+ Pitting Edema present.     Left lower leg: 2+ Pitting Edema present.     Comments: Diffuse calf TTP bilaterally. Pain worsened with dorsiflexion of ankles bilaterally. Motor 5/5 of LUE. Sensation 2/2 LUE. Grip strength equal. No infectious overlying skin changes of BUE.  Skin:    General: Skin is warm.     Capillary Refill: Capillary refill takes less than 2 seconds.     Coloration: Skin is not jaundiced or pale.  Neurological:     Mental Status: She is alert and oriented to person, place, and time. Mental status is at baseline.     ED Results / Procedures / Treatments   Labs (all labs ordered are listed, but only abnormal results are displayed) Labs Reviewed  BASIC METABOLIC PANEL WITH GFR - Abnormal; Notable for the following components:      Result Value    Glucose, Bld 129 (*)    Calcium 8.8 (*)    All other components within normal limits  CBC - Abnormal; Notable for the following components:   WBC 15.4 (*)    RBC 3.83 (*)    Hemoglobin 11.4 (*)    All other components within normal limits  D-DIMER, QUANTITATIVE - Abnormal; Notable for the following components:   D-Dimer, Quant 0.97 (*)    All other components within normal limits  TROPONIN I (HIGH SENSITIVITY) - Abnormal; Notable for the following components:   Troponin I (High Sensitivity) 18 (*)    All other components within normal limits  TROPONIN I (HIGH SENSITIVITY) - Abnormal; Notable for the following components:   Troponin I (High Sensitivity) 19 (*)    All other components within normal limits  HCG, SERUM, QUALITATIVE  BRAIN NATRIURETIC PEPTIDE  CK  TROPONIN I (HIGH SENSITIVITY)    EKG EKG Interpretation Date/Time:  Tuesday July 10 2023 16:35:56 EDT Ventricular Rate:  92 PR Interval:  148 QRS Duration:  84 QT Interval:  350 QTC Calculation: 432 R Axis:   -12  Text Interpretation: Normal sinus rhythm Normal ECG When compared with ECG of 14-Dec-2022 23:28, PREVIOUS ECG IS PRESENT Confirmed by Gwenetta Lennert [id number]) on 07/11/2023 4:33:36 AM  Radiology LE VENOUS Result Date: 07/11/2023  Lower Venous DVT Study Patient Name:  ANNALESE FANTE  Date of Exam:   07/11/2023 Medical Rec #: [id number]                Accession #:    [id number] Date of Birth: 08/20/1975                 Patient Gender: F Patient Age:   61 years Exam Location:  [facility] Procedure:      VAS US  LOWER EXTREMITY VENOUS (DVT) Referring Phys: RILEY RANSOM --------------------------------------------------------------------------------  Indications: Pain. Other Indications: PT states came home from [place] 3 days ago. Pt states                    before she left started to have leg cramping, SOB, chest pain                    and pain and numbness in left arm. Pt states legs hurt worse                     with walking. Risk Factors: Obesity. Comparison Study: 06/12/19 - Negative left leg. Performing Technologist: Franky Ivanoff Sturdivant-Jones RDMS, RVT  Examination Guidelines: A complete evaluation includes B-mode imaging, spectral  Doppler, color Doppler, and power Doppler as needed of all accessible portions of each vessel. Bilateral testing is considered an integral part of a complete examination. Limited examinations for reoccurring indications may be performed as noted. The reflux portion of the exam is performed with the patient in reverse Trendelenburg.  +---------+---------------+---------+-----------+----------+--------------+ RIGHT    CompressibilityPhasicitySpontaneityPropertiesThrombus Aging +---------+---------------+---------+-----------+----------+--------------+ CFV      Full           Yes      Yes                                 +---------+---------------+---------+-----------+----------+--------------+ SFJ      Full                                                        +---------+---------------+---------+-----------+----------+--------------+ FV Prox  Full                                                        +---------+---------------+---------+-----------+----------+--------------+ FV Mid   Full                                                        +---------+---------------+---------+-----------+----------+--------------+ FV DistalFull                                                        +---------+---------------+---------+-----------+----------+--------------+ PFV      Full                                                        +---------+---------------+---------+-----------+----------+--------------+ POP      Full           Yes      Yes                                 +---------+---------------+---------+-----------+----------+--------------+ PTV      Full                                                         +---------+---------------+---------+-----------+----------+--------------+ PERO     Full                                                        +---------+---------------+---------+-----------+----------+--------------+   +---------+---------------+---------+-----------+----------+--------------+  LEFT     CompressibilityPhasicitySpontaneityPropertiesThrombus Aging +---------+---------------+---------+-----------+----------+--------------+ CFV      Full           Yes      Yes                                 +---------+---------------+---------+-----------+----------+--------------+ SFJ      Full                                                        +---------+---------------+---------+-----------+----------+--------------+ FV Prox  Full                                                        +---------+---------------+---------+-----------+----------+--------------+ FV Mid   Full                                                        +---------+---------------+---------+-----------+----------+--------------+ FV DistalFull                                                        +---------+---------------+---------+-----------+----------+--------------+ PFV      Full                                                        +---------+---------------+---------+-----------+----------+--------------+ POP      Full           Yes      Yes                                 +---------+---------------+---------+-----------+----------+--------------+ PTV      Full                                                        +---------+---------------+---------+-----------+----------+--------------+ PERO     Full                                                        +---------+---------------+---------+-----------+----------+--------------+     Summary: BILATERAL: - No evidence of deep vein thrombosis seen in the lower extremities, bilaterally. -No evidence of  popliteal cyst, bilaterally.   *See table(s) above for measurements and observations.    Preliminary    CT Angio Chest PE W and/or  Wo Contrast Result Date: 07/10/2023 EXAM: CTA of the Chest with contrast for PE 07/10/2023 09:34:00 PM TECHNIQUE: CTA of the chest was performed after the administration of intravenous contrast. Multiplanar reformatted images are provided for review. MIP images are provided for review. Automated exposure control, iterative reconstruction, and/or weight based adjustment of the mA/kV was utilized to reduce the radiation dose to as low as reasonably achievable. COMPARISON: Chest radiograph earlier today and CTA chest 12/17/2013. CLINICAL HISTORY: Pulmonary embolism (PE) suspected, high prob. FINDINGS: PULMONARY ARTERIES: Pulmonary arteries are adequately opacified for evaluation. No evidence of pulmonary embolism. Main pulmonary artery is normal in caliber. MEDIASTINUM: No evidence of mediastinal lymphadenopathy. The heart and pericardium demonstrate no acute abnormality. There is no acute abnormality of the thoracic aorta. LYMPH NODES: No evidence of mediastinal, hilar or axillary lymphadenopathy. LUNGS AND PLEURA: The lungs are without acute process. No focal consolidation or pulmonary edema. No evidence of pleural effusion or pneumothorax. UPPER ABDOMEN: Limited images of the upper abdomen are unremarkable. SOFT TISSUES AND BONES: Degenerative changes of the visualized thoracolumbar spine. No acute bone or soft tissue abnormality. IMPRESSION: 1. No evidence of pulmonary embolism. 2. Normal CT chest. Electronically signed by: Zadie Herter MD 07/10/2023 09:40 PM EDT RP Workstation: [id number]   DG Chest 2 View Result Date: 07/10/2023 CLINICAL DATA:  Chest pain. EXAM: CHEST - 2 VIEW COMPARISON:  Chest radiograph dated 12/14/2022. FINDINGS: No focal consolidation, pleural effusion, pneumothorax. The cardiac silhouette is within normal limits. No acute osseous pathology. IMPRESSION:  No active cardiopulmonary disease. Electronically Signed   By: Angus Bark M.D.   On: 07/10/2023 17:18    Procedures Procedures    Medications Ordered in ED Medications  morphine  (PF) 4 MG/ML injection 4 mg (4 mg Intravenous Given 07/10/23 2036)  iohexol  (OMNIPAQUE ) 350 MG/ML injection 75 mL (75 mLs Intravenous Contrast Given 07/10/23 2135)  furosemide  (LASIX ) injection 20 mg (20 mg Intravenous Given 07/11/23 0028)    ED Course/ Medical Decision Making/ A&P                                 Medical Decision Making Amount and/or Complexity of Data Reviewed Labs: ordered. Radiology: ordered.  Risk Prescription drug management.   Patient presents to the ED for concern of pedal edema, CP, SHOB, this involves an extensive number of treatment options, and is a complaint that carries with it a high risk of complications and morbidity.  The differential diagnosis includes PE, ACS, fluid overload, HF exacerbation, DVT, cellulitis, pneumonia   Co morbidities that complicate the patient evaluation  See HPI   Additional history obtained:  Additional history obtained from Nursing   External records from outside source obtained and reviewed including triage RN note   Lab Tests:  I Ordered, and personally interpreted labs.  The pertinent results include:   Troponin 15 then 18 Dimer 0.92 WBC 15.4 Hgb 11.4   Imaging Studies ordered:  I ordered imaging studies including CXR, CTPE  I independently visualized and interpreted imaging which showed normal CT, CXR I agree with the radiologist interpretation   Cardiac Monitoring:  The patient was maintained on a cardiac monitor.  I personally viewed and interpreted the cardiac monitored which showed an underlying rhythm of: NSR with no T wave abnormalities   Medicines ordered and prescription drug management:  I ordered medication including morphine  and lasix   for pain and pedal edema  Reevaluation of the patient after these  medicines showed that the patient stayed the same I have reviewed the patients home medicines and have made adjustments as needed    Problem List / ED Course:  HA No alarm symptoms to include visual disturbances, meningismus, fever, sudden onset She is neurologically intact Improved following analgesia  LUE pain/paresthesia Likely 2/2 cervical radiculopathy and / or muscle strain as she has some minor paraspinous neck pain as well She is neurologically intact with no weakness nor sensory deficit despite her reporting "numbness". She has 2 point discrimination Low suspicion for emergent cause of symptoms such as MS, ischemic limb, infection, CVA/TIA with reassuring PE noted above Pedal edema Dimer 0.92 RLE 1+ pitting edema and LLE 2+ pitting edema. In setting of recent travel, new onset of edema, recent [place] travel, and elevated dimer, I think she would benefit from bilateral DVT study. However, we do not have US  tonight, shared decision making is had with patient regarding starting thinners tonight for [facility]. She does not wish to start these now and wait until US  results tomorrow Provided lasix  20 IV as I do not know her most recent lasix  dosage from [place] as she appears to have pedal edema bilaterally.  No effusion nor edema on CXR nor CTPE Could be related to DVT or dependent edema or venous insufficiency Low suspicion for cellulitis as BLE are noninfectious appearing. No leukocytosis nor fever nor temp Low suspicion for ischemic limb or compartment syndrome with BLE both well perfused and warm SHOB CP EKG NSR with no t wave abnormalities First trop 15. Second 18. Has been 23-20 6 months ago However, with CP, shob, I think a third troponin should be obtained for trending CTPE negative for PE and no other acute findings on CT Lung sounds CTAB Provided one dose of lasix  with some improvement to dyspnea CP much improved following analgesia Think she would benefit from cardiology  referral as she likely has had asymptomatic undiagnosed HTN for some time now. May have other RF as she does not have a regular PCP who has been following her.     Reevaluation:  After the interventions noted above, I reevaluated the patient and found that they have :improved    Dispostion:  After consideration of the diagnostic results and the patients response to treatment, I feel that the patent would benefit from outpatient management with PCP and cards f/u  Sign out to [facility] pending 3rd troponin   Final Clinical Impression(s) / ED Diagnoses Final diagnoses:  Pedal edema  Chest pain, unspecified type  Shortness of breath    Rx / DC Orders ED Discharge Orders          Ordered    Ambulatory referral to Cardiology        07/11/23 0441    LE VENOUS        07/11/23 0435              Royann Cords, PA 07/11/23 1258    Sallyanne Creamer, DO 07/16/23 1148

## 2023-07-10 NOTE — ED Triage Notes (Signed)
 PT states came home from [place] 3 days ago. Pt states before she left started to have leg cramping, SOB, chest pain and pain and numbness in left arm. Pt states legs hurt worse with walking. Was on flight for 4 hours on way home Sunday.

## 2023-07-11 ENCOUNTER — Emergency Department (HOSPITAL_COMMUNITY)
Admission: RE | Admit: 2023-07-11 | Discharge: 2023-07-11 | Disposition: A | Discharge status: Home / Self Care | Source: Ambulatory Visit | Attending: Emergency Medicine

## 2023-07-11 DIAGNOSIS — M79606 Pain in leg, unspecified: Secondary | ICD-10-CM

## 2023-07-11 LAB — BRAIN NATRIURETIC PEPTIDE: B Natriuretic Peptide: 16.7 pg/mL (ref 0.0–100.0)

## 2023-07-11 LAB — CK: Total CK: 75 U/L (ref 38–234)

## 2023-07-11 LAB — TROPONIN I (HIGH SENSITIVITY): Troponin I (High Sensitivity): 19 ng/L — ABNORMAL HIGH (ref <18)

## 2023-07-11 NOTE — ED Notes (Signed)
 AVS provided by edp was reviewed with pt. Pt verbalized understanding with no additional questions at this time. Pt also confirmed understanding of pending vascular US  later this morning. Pt wheelchair to car going home with family at bedside

## 2023-07-11 NOTE — ED Provider Notes (Signed)
 Physical Exam  BP (!) 138/91   Pulse 77   Temp 97.7 F (36.5 C)   Resp 18   Wt 135.2 kg   LMP 07/10/2023 (Exact Date)   SpO2 96%   BMI 56.32 kg/m   Physical Exam Vitals and nursing note reviewed.  Constitutional:      General: She is not in acute distress.    Appearance: She is not ill-appearing or toxic-appearing.  Cardiovascular:     Rate and Rhythm: Normal rate.     Pulses:          Radial pulses are 2+ on the right side and 2+ on the left side.       Dorsalis pedis pulses are 2+ on the right side and 2+ on the left side.       Posterior tibial pulses are 2+ on the right side and 2+ on the left side.  Pulmonary:     Effort: Pulmonary effort is normal. No respiratory distress.  Musculoskeletal:     Right lower leg: Tenderness present. Edema present.     Left lower leg: Tenderness present. Edema present.     Comments: Strength is intact in the lower extremities bilaterally.  Sensation reportedly intact and symmetric per patient.  Color or temperature appear and feel symmetric. Compartments soft.   Skin:    General: Skin is warm and dry.  Neurological:     Mental Status: She is alert.     Procedures  Procedures  ED Course / MDM    Medical Decision Making Amount and/or Complexity of Data Reviewed Labs: ordered. Radiology: ordered.  Risk Prescription drug management.   Accepted handoff at shift change from Paris Bolds PA-C. Please see prior provider note for more detail.   Plan is for trend troponins.   Troponins are 15,18, and 19. The patient has had troponins in the 20s before.   On my evaluation, the patient reports that she is feeling better. She was doing more walking an exercise than she typically does. This could be the cause of her muscle aches.  She also has some pedal edema.  Could be venous stasis that this is more significant on the left side.  Out of abundance of precaution, we will send her for bilateral DVT ultrasounds given her long travel.  CK  has been normal limits, doubt any rhabdomyolysis.  She was placed on Lasix  and monitor this.  Discussed with her that if a PCP prescribed this to her that she should take as prescribed.  I did refer her to a cardiologist as well as a PCP in [facility] for her to follow-up with.  She is neuro vastly intact distally.  No longer experiencing chest pain.  She has mildly elevated blood pressure otherwise is hemodynamically stable.  Patient is aware that she needs to return for bilateral DVT studies tomorrow.  This with her if these are negative that she needs to make sure that she is staying hydrated and elevating her feet.  Discussed that she needs to follow-up with cardiology as well as a primary care provider.  We discussed strict return precautions for flag symptoms.  Patient verbalized her understanding and agrees to the plan.  Patient is stable being discharged home in good condition.  Results for orders placed or performed during the hospital encounter of 07/10/23  Brain natriuretic peptide   Collection Time: 07/10/23  5:01 PM  Result Value Ref Range   B Natriuretic Peptide 16.7 0.0 - 100.0 pg/mL  Basic metabolic  panel   Collection Time: 07/10/23  5:03 PM  Result Value Ref Range   Sodium 138 135 - 145 mmol/L   Potassium 3.7 3.5 - 5.1 mmol/L   Chloride 104 98 - 111 mmol/L   CO2 26 22 - 32 mmol/L   Glucose, Bld 129 (H) 70 - 99 mg/dL   BUN 18 6 - 20 mg/dL   Creatinine, Ser 0.98 0.44 - 1.00 mg/dL   Calcium 8.8 (L) 8.9 - 10.3 mg/dL   GFR, Estimated >11 >91 mL/min   Anion gap 8 5 - 15  CBC   Collection Time: 07/10/23  5:03 PM  Result Value Ref Range   WBC 15.4 (H) 4.0 - 10.5 K/uL   RBC 3.83 (L) 3.87 - 5.11 MIL/uL   Hemoglobin 11.4 (L) 12.0 - 15.0 g/dL   HCT 47.8 29.5 - 62.1 %   MCV 94.0 80.0 - 100.0 fL   MCH 29.8 26.0 - 34.0 pg   MCHC 31.7 30.0 - 36.0 g/dL   RDW 30.8 65.7 - 84.6 %   Platelets 339 150 - 400 K/uL   nRBC 0.0 0.0 - 0.2 %  hCG, serum, qualitative   Collection Time: 07/10/23   5:03 PM  Result Value Ref Range   Preg, Serum NEGATIVE NEGATIVE  D-dimer, quantitative   Collection Time: 07/10/23  5:03 PM  Result Value Ref Range   D-Dimer, Quant 0.97 (H) 0.00 - 0.50 ug/mL-FEU  Troponin I (High Sensitivity)   Collection Time: 07/10/23  5:03 PM  Result Value Ref Range   Troponin I (High Sensitivity) 15 <18 ng/L  CK   Collection Time: 07/10/23  8:35 PM  Result Value Ref Range   Total CK 75 38 - 234 U/L  Troponin I (High Sensitivity)   Collection Time: 07/10/23  8:35 PM  Result Value Ref Range   Troponin I (High Sensitivity) 18 (H) <18 ng/L  Troponin I (High Sensitivity)   Collection Time: 07/11/23  1:04 AM  Result Value Ref Range   Troponin I (High Sensitivity) 19 (H) <18 ng/L   LE VENOUS Result Date: 07/11/2023  Lower Venous DVT Study Patient Name:  Madison Flowers  Date of Exam:   07/11/2023 Medical Rec #: [id number]                Accession #:    [id number] Date of Birth: 12-20-1975                 Patient Gender: F Patient Age:   7 years Exam Location:  [facility] Procedure:      VAS US  LOWER EXTREMITY VENOUS (DVT) Referring Phys: Willo Yoon --------------------------------------------------------------------------------  Indications: Pain. Other Indications: PT states came home from [place] 3 days ago. Pt states                    before she left started to have leg cramping, SOB, chest pain                    and pain and numbness in left arm. Pt states legs hurt worse                    with walking. Risk Factors: Obesity. Comparison Study: 06/12/19 - Negative left leg. Performing Technologist: Franky Ivanoff Sturdivant-Jones RDMS, RVT  Examination Guidelines: A complete evaluation includes B-mode imaging, spectral Doppler, color Doppler, and power Doppler as needed of all accessible portions of each vessel. Bilateral testing is considered an integral part of  a complete examination. Limited examinations for reoccurring indications may be performed as noted. The  reflux portion of the exam is performed with the patient in reverse Trendelenburg.  +---------+---------------+---------+-----------+----------+--------------+ RIGHT    CompressibilityPhasicitySpontaneityPropertiesThrombus Aging +---------+---------------+---------+-----------+----------+--------------+ CFV      Full           Yes      Yes                                 +---------+---------------+---------+-----------+----------+--------------+ SFJ      Full                                                        +---------+---------------+---------+-----------+----------+--------------+ FV Prox  Full                                                        +---------+---------------+---------+-----------+----------+--------------+ FV Mid   Full                                                        +---------+---------------+---------+-----------+----------+--------------+ FV DistalFull                                                        +---------+---------------+---------+-----------+----------+--------------+ PFV      Full                                                        +---------+---------------+---------+-----------+----------+--------------+ POP      Full           Yes      Yes                                 +---------+---------------+---------+-----------+----------+--------------+ PTV      Full                                                        +---------+---------------+---------+-----------+----------+--------------+ PERO     Full                                                        +---------+---------------+---------+-----------+----------+--------------+   +---------+---------------+---------+-----------+----------+--------------+ LEFT     CompressibilityPhasicitySpontaneityPropertiesThrombus Aging +---------+---------------+---------+-----------+----------+--------------+ CFV      Full  Yes       Yes                                 +---------+---------------+---------+-----------+----------+--------------+ SFJ      Full                                                        +---------+---------------+---------+-----------+----------+--------------+ FV Prox  Full                                                        +---------+---------------+---------+-----------+----------+--------------+ FV Mid   Full                                                        +---------+---------------+---------+-----------+----------+--------------+ FV DistalFull                                                        +---------+---------------+---------+-----------+----------+--------------+ PFV      Full                                                        +---------+---------------+---------+-----------+----------+--------------+ POP      Full           Yes      Yes                                 +---------+---------------+---------+-----------+----------+--------------+ PTV      Full                                                        +---------+---------------+---------+-----------+----------+--------------+ PERO     Full                                                        +---------+---------------+---------+-----------+----------+--------------+     Summary: BILATERAL: - No evidence of deep vein thrombosis seen in the lower extremities, bilaterally. -No evidence of popliteal cyst, bilaterally.   *See table(s) above for measurements and observations. Electronically signed by Genny Kid MD on 07/11/2023 at 8:29:54 PM.    Final    CT Angio Chest PE W and/or Wo Contrast Result Date: 07/10/2023 EXAM: CTA of the Chest with contrast for PE  07/10/2023 09:34:00 PM TECHNIQUE: CTA of the chest was performed after the administration of intravenous contrast. Multiplanar reformatted images are provided for review. MIP images are provided for review. Automated  exposure control, iterative reconstruction, and/or weight based adjustment of the mA/kV was utilized to reduce the radiation dose to as low as reasonably achievable. COMPARISON: Chest radiograph earlier today and CTA chest 12/17/2013. CLINICAL HISTORY: Pulmonary embolism (PE) suspected, high prob. FINDINGS: PULMONARY ARTERIES: Pulmonary arteries are adequately opacified for evaluation. No evidence of pulmonary embolism. Main pulmonary artery is normal in caliber. MEDIASTINUM: No evidence of mediastinal lymphadenopathy. The heart and pericardium demonstrate no acute abnormality. There is no acute abnormality of the thoracic aorta. LYMPH NODES: No evidence of mediastinal, hilar or axillary lymphadenopathy. LUNGS AND PLEURA: The lungs are without acute process. No focal consolidation or pulmonary edema. No evidence of pleural effusion or pneumothorax. UPPER ABDOMEN: Limited images of the upper abdomen are unremarkable. SOFT TISSUES AND BONES: Degenerative changes of the visualized thoracolumbar spine. No acute bone or soft tissue abnormality. IMPRESSION: 1. No evidence of pulmonary embolism. 2. Normal CT chest. Electronically signed by: Zadie Herter MD 07/10/2023 09:40 PM EDT RP Workstation: [id number]   DG Chest 2 View Result Date: 07/10/2023 CLINICAL DATA:  Chest pain. EXAM: CHEST - 2 VIEW COMPARISON:  Chest radiograph dated 12/14/2022. FINDINGS: No focal consolidation, pleural effusion, pneumothorax. The cardiac silhouette is within normal limits. No acute osseous pathology. IMPRESSION: No active cardiopulmonary disease. Electronically Signed   By: Angus Bark M.D.   On: 07/10/2023 17:18        Spence Dux, PA-C 07/12/23 [id number]    Edson Graces, MD 07/13/23 [id number]

## 2023-07-11 NOTE — Discharge Instructions (Addendum)
 You were seen in the ER today for evaluation of your symptoms. I am going to refer you to a cardiologist. The office should call you in the next few days to schedule an appointment. If not, please call to schedule. Additionally, it is important that you establish a primary care provider in the [place] . I have included the information for one in the discharge paperwork for you to call to schedule. Please make sure that you are elevating your legs above your heart as this will help with some of the pain and swelling. You will need to return later today to have the DVT ultrasounds done. If these are negative, this is likely venous insufficiency which is treated with compression socks, elevating feet, and sometimes a diuretic. If you have any concerns, new or worsening symptoms, please return to the ER for re-evaluation.   Hoy lo atendieron en urgencias para evaluar sus sntomas. Lo derivar a un cardilogo. El Naval architect en los prximos das para programar una cita. De lo contrario, por favor, llame para programarla. Adems, es importante que establezca un mdico de cabecera en [place] He incluido la informacin de uno en la documentacin del alta para que llame y Conservation officer, historic buildings. Asegrese de Optometrist las piernas por encima del nivel del corazn, ya que esto aliviar el dolor y la inflamacin. Deber regresar ms tarde hoy para que le realicen las ecografas de TVP. Si son negativas, es probable que se trate de insuficiencia venosa, que se trata con medias de compresin, elevacin de pies y, a veces, un diurtico. Si tiene alguna inquietud, sntomas nuevos o que [place], regrese a urgencias para Programmer, systems.   Comunquese con un mdico si: El dolor de pecho no desaparece. Se siente deprimido. Tiene fiebre. Nota cambios en los sntomas o desarrolla sntomas nuevos. Solicite ayuda de inmediato si: El dolor en el pecho empeora. Tiene tos que empeora o tose con [place]. Siente un dolor intenso en  el abdomen. Se desmaya. Tiene una molestia repentina e inexplicable en el pecho. Tiene molestias repentinas e inexplicables en los brazos, la espalda, el cuello o la [place]. Le falta el aire en cualquier momento. Comienza a sudar de [place] repentina o la piel se le humedece. Siente nuseas o vomita. Se siente repentinamente mareado o se desmaya. Tiene debilidad intensa, o debilidad o fatiga sin explicacin. Siente que el corazn comienza a latir rpidamente o que se saltea latidos. Estos sntomas pueden representar un problema grave que constituye Radio broadcast assistant. No espere a ver si los sntomas desaparecen. Solicite atencin mdica de inmediato. Comunquese con el servicio de emergencias de su localidad (911 en los [place]). No conduzca por sus propios medios [employer].

## 2023-07-20 DIAGNOSIS — G8929 Other chronic pain: Secondary | ICD-10-CM | POA: Insufficient documentation

## 2023-07-20 DIAGNOSIS — R102 Pelvic and perineal pain unspecified side: Secondary | ICD-10-CM | POA: Insufficient documentation

## 2023-07-20 DIAGNOSIS — E7800 Pure hypercholesterolemia, unspecified: Secondary | ICD-10-CM | POA: Insufficient documentation

## 2023-07-20 DIAGNOSIS — M1990 Unspecified osteoarthritis, unspecified site: Secondary | ICD-10-CM | POA: Insufficient documentation

## 2023-07-20 DIAGNOSIS — D72829 Elevated white blood cell count, unspecified: Secondary | ICD-10-CM | POA: Insufficient documentation

## 2023-07-20 DIAGNOSIS — M1712 Unilateral primary osteoarthritis, left knee: Secondary | ICD-10-CM | POA: Insufficient documentation

## 2023-07-20 DIAGNOSIS — M51379 Other intervertebral disc degeneration, lumbosacral region without mention of lumbar back pain or lower extremity pain: Secondary | ICD-10-CM | POA: Insufficient documentation

## 2023-07-20 DIAGNOSIS — K219 Gastro-esophageal reflux disease without esophagitis: Secondary | ICD-10-CM | POA: Insufficient documentation

## 2023-07-24 ENCOUNTER — Ambulatory Visit

## 2023-07-24 DIAGNOSIS — G8929 Other chronic pain: Secondary | ICD-10-CM

## 2023-07-24 DIAGNOSIS — M1990 Unspecified osteoarthritis, unspecified site: Secondary | ICD-10-CM
